# Patient Record
Sex: Male | Born: 1955 | Race: White | Hispanic: No | Marital: Married | Smoking: Former smoker
Health system: Southern US, Community
[De-identification: ages and names within clinical notes are randomized; demographics above are authoritative.]

## PROBLEM LIST (undated history)

## (undated) DIAGNOSIS — Z973 Presence of spectacles and contact lenses: Secondary | ICD-10-CM

## (undated) DIAGNOSIS — K2990 Gastroduodenitis, unspecified, without bleeding: Secondary | ICD-10-CM

## (undated) DIAGNOSIS — R931 Abnormal findings on diagnostic imaging of heart and coronary circulation: Secondary | ICD-10-CM

## (undated) DIAGNOSIS — K2970 Gastritis, unspecified, without bleeding: Secondary | ICD-10-CM

## (undated) DIAGNOSIS — Z8372 Family history of familial adenomatous polyposis: Secondary | ICD-10-CM

## (undated) DIAGNOSIS — D1391 Familial adenomatous polyposis: Secondary | ICD-10-CM

## (undated) DIAGNOSIS — K219 Gastro-esophageal reflux disease without esophagitis: Secondary | ICD-10-CM

## (undated) DIAGNOSIS — E785 Hyperlipidemia, unspecified: Secondary | ICD-10-CM

## (undated) DIAGNOSIS — I219 Acute myocardial infarction, unspecified: Secondary | ICD-10-CM

## (undated) DIAGNOSIS — Z8371 Family history of colonic polyps: Secondary | ICD-10-CM

## (undated) DIAGNOSIS — Z83719 Family history of colon polyps, unspecified: Secondary | ICD-10-CM

## (undated) HISTORY — DX: Family history of colonic polyps: Z83.71

## (undated) HISTORY — PX: SIGMOIDOSCOPY: SUR1295

## (undated) HISTORY — PX: UPPER GASTROINTESTINAL ENDOSCOPY: SHX188

## (undated) HISTORY — PX: COLONOSCOPY: SHX174

## (undated) HISTORY — DX: Familial adenomatous polyposis: D13.91

## (undated) HISTORY — PX: OTHER SURGICAL HISTORY: SHX169

## (undated) HISTORY — DX: Gastritis, unspecified, without bleeding: K29.90

## (undated) HISTORY — PX: ROTATOR CUFF REPAIR: SHX139

## (undated) HISTORY — DX: Abnormal findings on diagnostic imaging of heart and coronary circulation: R93.1

## (undated) HISTORY — PX: CARDIAC CATHETERIZATION: SHX172

## (undated) HISTORY — DX: Gastro-esophageal reflux disease without esophagitis: K21.9

## (undated) HISTORY — DX: Hyperlipidemia, unspecified: E78.5

## (undated) HISTORY — DX: Gastritis, unspecified, without bleeding: K29.70

## (undated) HISTORY — DX: Family history of colon polyps, unspecified: Z83.719

## (undated) HISTORY — DX: Acute myocardial infarction, unspecified: I21.9

## (undated) HISTORY — DX: Family history of familial adenomatous polyposis: Z83.72

## (undated) HISTORY — PX: WRIST SURGERY: SHX841

---

## 2000-12-05 HISTORY — PX: WRIST SURGERY: SHX841

## 2004-11-07 ENCOUNTER — Ambulatory Visit (HOSPITAL_COMMUNITY): Admission: RE | Admit: 2004-11-07 | Discharge: 2004-11-07 | Payer: Self-pay | Admitting: Neurosurgery

## 2010-12-25 ENCOUNTER — Encounter: Payer: Self-pay | Admitting: Neurosurgery

## 2017-07-13 DIAGNOSIS — M6240 Contracture of muscle, unspecified site: Secondary | ICD-10-CM

## 2017-07-13 DIAGNOSIS — M722 Plantar fascial fibromatosis: Secondary | ICD-10-CM | POA: Insufficient documentation

## 2017-07-13 HISTORY — DX: Plantar fascial fibromatosis: M72.2

## 2017-07-13 HISTORY — DX: Contracture of muscle, unspecified site: M62.40

## 2018-01-01 DIAGNOSIS — R739 Hyperglycemia, unspecified: Secondary | ICD-10-CM | POA: Insufficient documentation

## 2018-01-01 DIAGNOSIS — I10 Essential (primary) hypertension: Secondary | ICD-10-CM

## 2018-01-01 DIAGNOSIS — Z720 Tobacco use: Secondary | ICD-10-CM | POA: Insufficient documentation

## 2018-01-01 DIAGNOSIS — I2102 ST elevation (STEMI) myocardial infarction involving left anterior descending coronary artery: Secondary | ICD-10-CM

## 2018-01-01 HISTORY — DX: Hyperglycemia, unspecified: R73.9

## 2018-01-01 HISTORY — DX: ST elevation (STEMI) myocardial infarction involving left anterior descending coronary artery: I21.02

## 2018-01-01 HISTORY — DX: Essential (primary) hypertension: I10

## 2018-01-01 HISTORY — DX: Tobacco use: Z72.0

## 2018-01-30 DIAGNOSIS — E1159 Type 2 diabetes mellitus with other circulatory complications: Secondary | ICD-10-CM

## 2018-01-30 HISTORY — DX: Type 2 diabetes mellitus with other circulatory complications: E11.59

## 2018-02-28 DIAGNOSIS — E785 Hyperlipidemia, unspecified: Secondary | ICD-10-CM

## 2018-02-28 DIAGNOSIS — I2510 Atherosclerotic heart disease of native coronary artery without angina pectoris: Secondary | ICD-10-CM

## 2018-02-28 HISTORY — DX: Atherosclerotic heart disease of native coronary artery without angina pectoris: I25.10

## 2018-03-14 DIAGNOSIS — R17 Unspecified jaundice: Secondary | ICD-10-CM

## 2018-03-14 DIAGNOSIS — R42 Dizziness and giddiness: Secondary | ICD-10-CM

## 2018-03-14 HISTORY — DX: Dizziness and giddiness: R42

## 2018-03-14 HISTORY — DX: Unspecified jaundice: R17

## 2018-12-17 ENCOUNTER — Inpatient Hospital Stay: Payer: Managed Care, Other (non HMO) | Attending: Genetic Counselor | Admitting: Genetic Counselor

## 2018-12-17 ENCOUNTER — Inpatient Hospital Stay: Payer: Managed Care, Other (non HMO)

## 2018-12-17 ENCOUNTER — Encounter: Payer: Self-pay | Admitting: Genetic Counselor

## 2018-12-17 DIAGNOSIS — Z8371 Family history of colonic polyps: Secondary | ICD-10-CM

## 2018-12-17 DIAGNOSIS — Z1379 Encounter for other screening for genetic and chromosomal anomalies: Secondary | ICD-10-CM

## 2018-12-17 DIAGNOSIS — Z860100 Personal history of colon polyps, unspecified: Secondary | ICD-10-CM

## 2018-12-17 HISTORY — DX: Personal history of colon polyps, unspecified: Z86.0100

## 2018-12-17 HISTORY — DX: Personal history of colonic polyps: Z86.010

## 2018-12-17 NOTE — Progress Notes (Signed)
REFERRING PROVIDER: Jackquline Denmark, MD [address] [place],  [address]  PRIMARY PROVIDER:  [name], MD  PRIMARY REASON FOR VISIT:  1. History of colon polyps   2. Family history of colonic polyps   3. Family history of FAP (familial adenomatous polyposis)      HISTORY OF PRESENT ILLNESS:   Larry Warner, a 63 y.o. male, was seen for a [facility] cancer genetics consultation at the request of Dr. Lyndel Safe due to a personal and family history of colon polyps and a need for a total colectomy.  Larry Warner presents to clinic today to discuss the possibility of a hereditary predisposition to cancer, genetic testing, and to further clarify his future cancer risks, as well as potential cancer risks for family members.   In 2013, at the age of 31, Larry Warner was diagnosed with 100's of colon polyps on his colonoscopy.  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  He notified family members, and his siblings had colonoscopies. His brother was noted to have colon polyps and had a colectomy.  While is sister and mother had colon polyps, they did not have the number that Larry Warner had and had polypectomy.  Larry Warner son underwent colonoscopy in the fall 2019 and was found to have 100-150 colon polyps.  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    CANCER HISTORY:   No history exists.     Past Medical History:  Diagnosis Date  . Family history of colonic polyps   . Family history of FAP (familial adenomatous polyposis)      Social History   Socioeconomic History  . Marital status: Married    Spouse name: Not on file  . Number of children: Not on file  . Years of education: Not on file  . Highest education level: Not on file  Occupational History  . Not on file  Social Needs  . Financial resource strain: Not on file  . Food insecurity:   Worry: Not on file    Inability: Not on file  . Transportation needs:    Medical: Not on file    Non-medical: Not on file  Tobacco Use  . Smoking status: Not on file  Substance and Sexual Activity  . Alcohol use: Not on file  . Drug use: Not on file  . Sexual activity: Not on file  Lifestyle  . Physical activity:    Days per week: Not on file    Minutes per session: Not on file  . Stress: Not on file  Relationships  . Social connections:    Talks on phone: Not on file    Gets together: Not on file    Attends religious service: Not on file    Active member of club or organization: Not on file    Attends meetings of clubs or organizations: Not on file    Relationship status: Not on file  Other Topics Concern  . Not on file  Social History Narrative  . Not on file     FAMILY HISTORY:  We obtained a detailed, 4-generation family history.  Significant diagnoses are listed below: Family History  Problem Relation Age of Onset  . Colon polyps Mother        <12 polyps  . Colon polyps Sister   . Colon polyps Brother  had colectomy due to polyp count  . Colon polyps Son 31       total colectomy  . Familial polyposis Son        Bartholome Bill mutation    The patient had one son who is cancer free, but has had a colectomy due to colon polyps and was identified as having a likely pathogenic mutation in [place].  He has a brother and sister, and two maternal half brothers who are all cancer free.  His full brother had multiple colon polyps and had a colectomy.  His sister was found to have colon polyps, and his half brothers have not had colonoscopies.  The patient's father is deceased and his mother is living.  The patient's mother has had <12 colon polyps.  She has a brother and two sisters who are all cancer free.  One sister had a son who died at 92 and had 'tumors all over his body'.  The maternal grandparents are deceased.  The patient's father committed suicide at 49.  He has one  brother who is cancer free.  His parents died of non cancer related issues.  Larry Warner is unaware of previous family history of genetic testing for hereditary cancer risks. Patient's maternal ancestors are of Caucasian descent, and paternal ancestors are of Caucasian descent. There is no reported Ashkenazi Jewish ancestry. There is no known consanguinity.  GENETIC COUNSELING ASSESSMENT: Larry Warner is a 63 y.o. male with a personal and family history of colon polyps necessitating a colectomy which is somewhat suggestive of a Familial Adenomatous Polyposis and predisposition to cancer. We, therefore, discussed and recommended the following at today's visit.   DISCUSSION: We discussed that FAP is thought to make up about 1% of all hereditary colon cancer syndromes.  There are two forms of FAP - Classical and Attenuated.  Individuals with classic FAP tend to have more polyps at younger ages, with a risk for cancer reaching 100%.  Attenuated FAP (AFAP) is associated with fewer polyps at older ages and an cancer risk approaching a 70% lifetime risk.  Larry Warner son was diagnosed with FAP and a likely pathogenic mutation was identified.  Larry Warner has a 50% risk for having this same mutation, however, we anticipate that it will be a confirmatory test due to his personal history.  We reviewed the characteristics, features and inheritance patterns of hereditary cancer syndromes. We also discussed genetic testing, including the appropriate family members to test, the process of testing, insurance coverage and turn-around-time for results. We discussed the implications of a negative, positive and/or variant of uncertain significant result. We recommended Larry Warner pursue genetic testing for the APC gene mutation.   Based on Larry Warner personal and family history of cancer, he meets medical criteria for genetic testing. Despite that he meets criteria, he may still have an out of pocket cost. We  discussed that if his out of pocket cost for testing is over $100, the laboratory will call and confirm whether he wants to proceed with testing.  If the out of pocket cost of testing is less than $100 he will be billed by the genetic testing laboratory.   PLAN: After considering the risks, benefits, and limitations, Larry Warner  provided informed consent to pursue genetic testing and the blood sample was sent to [facility] for analysis of the APC targeted test. Results should be available within approximately 2-3 weeks' time, at which point they will be disclosed by telephone to Larry Warner,  as will any additional recommendations warranted by these results. Larry Warner will receive a summary of his genetic counseling visit and a copy of his results once available. This information will also be available in Epic. We encouraged Larry. Ahr to remain in contact with cancer genetics annually so that we can continuously update the family history and inform him of any changes in cancer genetics and testing that may be of benefit for his family. Larry. Bunch questions were answered to his satisfaction today. Our contact information was provided should additional questions or concerns arise.  Based on Larry. Zorn family history, we recommended his brother, sister and mother, who were all diagnosed with colon polyps, have genetic counseling and testing. Larry Warner will let us know if we can be of any assistance in coordinating genetic counseling and/or testing for this family member.   Lastly, we encouraged Larry. Capers to remain in contact with cancer genetics annually so that we can continuously update the family history and inform him of any changes in cancer genetics and testing that may be of benefit for this family.   Larry.  Maus questions were answered to his satisfaction today. Our contact information was provided should additional questions or concerns arise. Thank you for the referral  and allowing [place] to share in the care of your patient.   Telina Kleckley P. Florene Glen, [place], [facility] Certified Genetic Counselor [email or web address] phone: [phone]  The patient was seen for a total of 60 minutes in face-to-face genetic counseling.  This patient was discussed with Drs. Magrinat, Lindi Adie and/or Burr Medico who agrees with the above.    _______________________________________________________________________ For Office Staff:  Number of people involved in session: 2 Was an Intern/ student involved with case: no

## 2018-12-28 ENCOUNTER — Encounter: Payer: Self-pay | Admitting: Genetic Counselor

## 2018-12-28 ENCOUNTER — Telehealth: Payer: Self-pay | Admitting: Genetic Counselor

## 2018-12-28 DIAGNOSIS — Z1379 Encounter for other screening for genetic and chromosomal anomalies: Secondary | ICD-10-CM

## 2018-12-28 DIAGNOSIS — Z1509 Genetic susceptibility to other malignant neoplasm: Secondary | ICD-10-CM | POA: Insufficient documentation

## 2018-12-28 DIAGNOSIS — Z1589 Genetic susceptibility to other disease: Secondary | ICD-10-CM

## 2018-12-28 HISTORY — DX: Genetic susceptibility to other malignant neoplasm: Z15.09

## 2018-12-28 HISTORY — DX: Encounter for other screening for genetic and chromosomal anomalies: Z13.79

## 2018-12-28 HISTORY — DX: Genetic susceptibility to other disease: Z15.89

## 2018-12-28 NOTE — Telephone Encounter (Signed)
LM on VM that results were back and to please call back. 

## 2018-12-31 NOTE — Telephone Encounter (Signed)
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  Discussed that this is confirmation of his clinical diagnosis of FAP.  Further discussed the importance of getting family members tested.  He confirmed that he has discussed with his family the importance of testing and is trying to get everyone in.

## 2019-01-02 ENCOUNTER — Ambulatory Visit: Payer: Self-pay | Admitting: Genetic Counselor

## 2019-01-02 DIAGNOSIS — Z1589 Genetic susceptibility to other disease: Secondary | ICD-10-CM

## 2019-01-02 DIAGNOSIS — Z1379 Encounter for other screening for genetic and chromosomal anomalies: Secondary | ICD-10-CM

## 2019-01-02 NOTE — Progress Notes (Signed)
GENETIC TEST RESULTS   Patient Name: Larry Warner Patient Age: 63 y.o. Encounter Date: 01/02/2019  Referring Provider: Jackquline Denmark, MD    Mr. Viveros was seen in the [facility] clinic on December 31, 2018 due to a personal and family history of colon polyps and concern regarding a hereditary predisposition to cancer in the family. Please refer to the prior Genetics clinic note for more information regarding Mr. Worrall's medical and family histories and our assessment at the time.   FAMILY HISTORY:  We obtained a detailed, 4-generation family history.  Significant diagnoses are listed below: Family History  Problem Relation Age of Onset  . Colon polyps Mother        <12 polyps  . Colon polyps Sister   . Colon polyps Brother        had colectomy due to polyp count  . Colon polyps Son 56       total colectomy  . Familial polyposis Son        Bartholome Bill mutation    The patient had one son who is cancer free, but has had a colectomy due to colon polyps and was identified as having a likely pathogenic mutation in [place].  He has a brother and sister, and two maternal half brothers who are all cancer free.  His full brother had multiple colon polyps and had a colectomy.  His sister was found to have colon polyps, and his half brothers have not had colonoscopies.  The patient's father is deceased and his mother is living.  The patient's mother has had <12 colon polyps.  She has a brother and two sisters who are all cancer free.  One sister had a son who died at 8 and had 'tumors all over his body'.  The maternal grandparents are deceased.  The patient's father committed suicide at 81.  He has one brother who is cancer free.  His parents died of non cancer related issues.  Mr. Kuwahara is unaware of previous family history of genetic testing for hereditary cancer risks. Patient's maternal ancestors are of Caucasian descent, and paternal ancestors are of Caucasian descent. There is no  reported Ashkenazi Jewish ancestry. There is no known consanguinity.    GENETIC TESTING: At the time of Mr. Creighton's visit, we recommended he pursue genetic testing for the specific APC mutation that was identified in the family. 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    CLINICAL CONDITION: Familial adenomatous polyposis (FAP) is a colorectal cancer predisposition syndrome characterized by the development of hundreds to thousands of precancerous (adenomatous) polyps, typically beginning in adolescence or early adulthood. Without a prophylactic colectomy, affected individuals have a lifetime risk of nearly 100% of developing colorectal cancer (PMID: 81829937, 16967893, 8101751).  Gastric adenocarcinoma and proximal polyposis of the stomach (GAPPS) is characterized by fundic gland polyposis and an increased risk of gastric cancer (gastric adenocarcinoma). Polyps are often associated with low-grade and focally high-grade dysplasia. Unlike other pathogenic variants in APC, the risk of colorectal polyposis and cancer appears to be lower. GAPPS is caused by pathogenic variants in promoter 1B of the APC gene (PMID: 02585277, 82423536, 14431540, 08676195, 09326712).  FAP was previously divided into subtypes including [place] and Gardner syndromes. These subtypes were defined by the presence of certain extracolonic findings such as desmoid tumors, sebaceous cysts, osteomas, supernumerary teeth, and cancers of the duodenum, exocrine pancreas, thyroid (papillary adenocarcinoma), liver (hepatoblastomas), and central  nervous system (medulloblastomas). It is now recognized that these subtypes are part of the clinical spectrum of APC-associated polyposis conditions (PMID: 95638756).  FAP may increase the risk of developing other types of non-colonic cancers:   4-12% risk of cancer of the duodenum  (PMID: 43329518, 84166063)  1-12% risk of papillary thyroid cancer (PMID: 01601093)  <1% risk of stomach cancer (PMID: 23557322)  <1% risk of brain/central nervous system malignancies (PMID: 02542706)  1-2% risk of hepatoblastoma in children (PMID: 2376283, 1517616)  increased risk of pancreatic cancer (PMID: 07371062, 69485462, 7035009)  Attenuated FAP (AFAP) has a later age of onset than classic FAP (approximately 61 years of age), presents with fewer adenomatous polyps (<100) that are primarily proximal (right-sided), and has an overall lower lifetime risk of developing colorectal cancer of approximately 70% (PMID: 38182993, 71696789, 3810175, 10258527).  GENE INFORMATION: APC is a tumor suppressor gene that acts as an antagonist of the Wnt signaling pathway. It is also involved in other processes including cell migration and adhesion, transcriptional activation, and apoptosis (NCBI Gene. Gene ID: [id number]. [email or web address]. Accessed February 2018). If there is a pathogenic variant in this gene that prevents it from normally functioning, there is an increased risk to develop certain types of cancers.  INHERITANCE: FAP has autosomal dominant inheritance. This means that an individual with a pathogenic variant has a 50% chance of passing the condition on to their offspring. With this result, it is now possible to identify at-risk relatives who can pursue testing for this specific familial variant. While many cases are inherited from a parent, some occur spontaneously (PMID: 712 229 0669). This means that an individual with a pathogenic variant has parents who do not have it. However, that individual now has a 50% risk of passing it on to future offspring.  MANAGEMENT: Medical management and surveillance protocols have been developed by the [employer]  (NCCN) for individuals with FAP and AFAP (NCCN Clinical Practice Guidelines in Oncology: Genetic/Familial High  Risk Assessment: Colorectal. Version 3.2017). These recommendations apply to individuals diagnosed with FAP and AFAP based on clinical findings or genetic test results, as well as individuals at an increased risk based on family history who have not had negative genetic testing:  Classic FAP Colon:   Annual sigmoidoscopy/colonoscopy beginning at 54-30 years of age.  A colectomy or proctocolectomy is recommended after numerous polyps are detected, due to their high malignant potential. Total proctocolectomy with ileal pouch-anal anastomosis (IPAA) is generally the recommended surgical approach for individuals with FAP.  If colectomy with ileorectal anastomosis (IRA) is performed, endoscopic evaluation of the remaining rectum is recommended every 6-12 months depending on polyp burden.  If total proctocolectomy with IPAA or ileostomy is performed, endoscopic evaluation of the ileal pouch or ileostomy is recommended every 1-3 years depending on polyp burden. If large, flat polyps with villous histology or polyps with high-grade dysplasia are identified, then surveillance frequency should be every 6 months.  Chemoprevention (e.g., sulindac) can aid in management of the remaining rectum; however, there are no medications currently approved by the FDA for this indication. There are data to suggest that sulindac showed the most significant polyp regression, but it is unclear if the decrease in polyp burden equates to reduction in colorectal cancer risk.  Extracolonic:  Upper endoscopy with complete visualization of the ampulla of Vater beginning at age 23-25 years.  Consider upper endoscopy at an earlier age if colectomy is performed prior to 61 years of age. Frequency of upper endoscopy surveillance is dependent on  polyp burden. It is important to note that fundic gland polyps are common in individuals with FAP and while focal low-grade dysplasia can be identified, it is typically non-progressive.  Non-fundic gland polyps should be managed endoscopically if possible. Polyps with high-grade dysplasia or invasive cancer detected on biopsy should be referred for gastrectomy.  Annual thyroid examination beginning in the late teenage years. Annual thyroid ultrasound may be considered, but data are lacking to support this recommendation.  Annual physical examination for CNS cancers.  Annual abdominal palpation for desmoids.  If there is family history of symptomatic desmoids, consider abdominal MRI or CT within 1-3 years post-colectomy, then every 5-10 years. Abdominal symptoms should prompt immediate abdominal imaging; however, data to support screening and treatment are limited. For small bowel polyps and cancer, consider adding small bowel visualization to MRI or CT for desmoids especially, if duodenal polyposis is advanced  Consider liver palpation, abdominal ultrasound, and measurement of AFP every 3-6 months during the first 5 years of life to screen for hepatoblastoma.  AFAP Colon:   Colonoscopy beginning in the late teens, then every 2-3 years.  If a small polyp burden is found, repeat colonoscopy with polypectomy every 1-2 years.  If polyp burden is too great, consider colectomy with ileorectal anastomosis (IRA) or proctocolectomy with ileal pouch-anal anastomosis (IPAA) if rectal polyposis is not manageable with polypectomy. Following colectomy with IRA, endoscopic evaluation of the remaining rectum is recommended every 6-12 months depending on polyp burden. Chemoprevention (e.g., sulindac) can aid in management of the remaining rectum; however, there are no medications currently approved by the FDA for this indication. There are data to suggest that sulindac showed the most significant polyp regression, but it is unclear if the decrease in polyp burden equates to reduction in colorectal cancer risk.  Extracolonic:  Annual physical examination.  Annual thyroid  examination.  Upper endoscopy with complete visualization of the ampulla of Vater beginning at around age 92-25 years. Consider upper endoscopy at an earlier age if colectomy is performed prior to 61 years of age. Frequency of upper endoscopy surveillance is dependent on polyp burden.  FAMILY MEMBERS: It is important that all of Mr. Aday relatives (both men and women) know of the presence of this gene mutation. Site-specific genetic testing can sort out who in the family is at risk and who is not. At this time we cannot predict the presentation of this mutation, however based on family history it is suggestive that it is presenting as A-FAP rather than a more classic form.  Family members will need to talk with their GI provider to determine how they would best be followed.  Mr. Guyette children and siblings have a 50% chance to have inherited this mutation. We recommend they have genetic testing for this same mutation, as identifying the presence of this mutation would allow them to also take advantage of risk-reducing measures.   SUPPORT AND RESOURCES: If Mr. Valone is interested in FAP-specific information and support, there are two groups, Hereditary Colon Cancer Takes Guts (www.hcctakesguts.org), and Colorectal cancer alliance (www.ccalliance.org) which some people have found useful. They provide opportunities to speak with other individuals from high-risk families. To locate genetic counselors in other cities, visit the website of the [employer] (ArtistMovie.se) and Secretary/administrator for a Social worker by zip code.  We encouraged Mr. Kissling to remain in contact with [place] on an annual basis so we can update his personal and family histories, and let him know of advances in cancer  genetics that may benefit the family. Our contact number was provided. Mr. Skillin questions were answered to his satisfaction today, and he knows he is welcome to call anytime with additional questions.    Zaharah Amir P. Florene Glen, [place], [facility] Certified Genetic Counselor [email or web address] phone: [phone]

## 2019-01-23 NOTE — Progress Notes (Signed)
Thanks Santiago Glad for taking care of him. I was out on vacation and just got back Excellent note  Bre, can you forward Karen's note to Mr Seevers's PCP as well

## 2019-01-24 NOTE — Progress Notes (Signed)
Note routed to PCP per MD recommendation

## 2019-03-21 ENCOUNTER — Encounter: Payer: Self-pay | Admitting: Gastroenterology

## 2019-05-10 DIAGNOSIS — I519 Heart disease, unspecified: Secondary | ICD-10-CM

## 2019-05-10 HISTORY — DX: Heart disease, unspecified: I51.9

## 2019-05-20 ENCOUNTER — Encounter: Payer: Self-pay | Admitting: Gastroenterology

## 2019-05-22 ENCOUNTER — Telehealth (INDEPENDENT_AMBULATORY_CARE_PROVIDER_SITE_OTHER): Payer: Managed Care, Other (non HMO) | Admitting: Gastroenterology

## 2019-05-22 ENCOUNTER — Encounter: Payer: Self-pay | Admitting: Gastroenterology

## 2019-05-22 ENCOUNTER — Other Ambulatory Visit: Payer: Self-pay

## 2019-05-22 VITALS — Ht 68.0 in | Wt 205.0 lb

## 2019-05-22 DIAGNOSIS — Z8371 Family history of colonic polyps: Secondary | ICD-10-CM

## 2019-05-22 DIAGNOSIS — D126 Benign neoplasm of colon, unspecified: Secondary | ICD-10-CM

## 2019-05-22 NOTE — Patient Instructions (Signed)
If you are age 61 or older, your body mass index should be between 23-30. Your Body mass index is 31.17 kg/m. If this is out of the aforementioned range listed, please consider follow up with your Primary Care Provider.  If you are age 12 or younger, your body mass index should be between 19-25. Your Body mass index is 31.17 kg/m. If this is out of the aformentioned range listed, please consider follow up with your Primary Care Provider.   You have been scheduled for a flexible sigmoidoscopy. Please follow the written instructions given to you at your visit today. If you use inhalers (even only as needed), please bring them with you on the day of your procedure.  To help prevent the possible spread of infection to our patients, communities, and staff; we will be implementing the following measures:  As of now we are not allowing any visitors/family members to accompany you to any upcoming appointments with [facility]. If you have any concerns about this please contact our office to discuss prior to the appointment.   You will be due for a recall Flex Sig and EGD in 2021. We will send you a reminder in the mail when it gets closer to that time.   Thank you,  Dr. Jackquline Denmark

## 2019-05-22 NOTE — Progress Notes (Signed)
Chief Complaint: FU  Referring Provider:  [name], MD      ASSESSMENT AND PLAN;   #1. 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 [place]). 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  #2.  Comorbid conditions CAD s/p DES 12/2017 (getting off Brilinta June end 2020), HTN, HLD, LVD (EF 45-50%), DM2.  Plan: - FS without sedation at [facility] in [place] (once he is off Brilinta).  Continue baby ASA throughout. -Continue Celebrex 200 mg p.o. twice daily. -Would recommend EGD (with conventional scope and ERCP scope) and repeat flex sig  2021.  Since this has to be done at [facility], we would like to avoid any potential COVID exposure from the hospital this year.  Also would set him up for US abdomen next year. HPI:    Larry Warner is a 63 y.o. male  For follow-up visit.  No nausea, vomiting, heartburn, regurgitation, odynophagia or dysphagia.  No significant diarrhea or constipation.  There is no melena or hematochezia. No unintentional weight loss.  Has been under care of geneticist Santiago Glad as well.  Past GI procedures: -Had several flexible sigmoidoscopies since surgery.  Was initially having it every 6 months.  Last FS 01/04/2017: Small external and internal hemorrhoids, otherwise normal to anastomosis. -Several EGDs.  Last EGD 01/04/2017 (EGD scope and ERCP scope)-mild gastritis.  Otherwise normal.  No periampullary tumors. Past Medical History:  Diagnosis Date  . Family history of colonic polyps   . Family history of FAP (familial adenomatous polyposis)   . GERD (gastroesophageal reflux disease)   . Hyperlipidemia     Past Surgical History:  Procedure Laterality Date  . 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 ESOPHAGOGASTRODUODENOSCOPY  01/04/2017   Mild gastrtis. Otherwise normal EGD with convetional scope and ERCP scope. No periampullary tumors.   .  Laprascopic assisted subtotal colectomy    . WRIST SURGERY      Family History  Problem Relation Age of Onset  . Colon polyps Mother        <12 polyps  . Colon polyps Sister   . Colon polyps Brother        had colectomy due to polyp count  . Colon polyps Son 3       total colectomy  . Familial polyposis Son        APC mutation  . Colon cancer Neg Hx     Social History   Tobacco Use  . Smoking status: Never Smoker  . Smokeless tobacco: Never Used  Substance Use Topics  . Alcohol use: Never    Frequency: Never  . Drug use: Not on file    Current Outpatient Medications  Medication Sig Dispense Refill  . aspirin 81 MG chewable tablet Chew by mouth daily.    Marland Kitchen atorvastatin (LIPITOR) 80 MG tablet Take 80 mg by mouth daily.    . carvedilol (COREG) 6.25 MG tablet Take 6.25 mg by mouth 2 (two) times daily with a meal.    . celecoxib (CELEBREX) 200 MG capsule Take 200 mg by mouth 2 (two) times daily.    Marland Kitchen glipiZIDE (GLUCOTROL) 5 MG tablet Take by mouth daily before breakfast.    . losartan (COZAAR) 25 MG tablet Take 25 mg by mouth daily.    . metFORMIN (GLUCOPHAGE) 850 MG tablet Take 850 mg by mouth 2 (two) times daily with a meal.    . ticagrelor (BRILINTA)  90 MG TABS tablet Take by mouth daily.    Marland Kitchen tolterodine (DETROL LA) 4 MG 24 hr capsule Take 4 mg by mouth daily.     No current facility-administered medications for this visit.     Not on File  Review of Systems:  neg     Physical Exam:    Ht 5\' 8"  (1.727 m)   Wt 205 lb (93 kg)   BMI 31.17 kg/m  Filed Weights   05/22/19 0808  Weight: 205 lb (93 kg)  televist   This service was provided via telemedicine.  The patient was located at work.  The provider was located in work.  The patient did consent to this telephone visit and is aware of possible charges through their insurance for this visit.  The patient was referred by Dr. Truman Hayward.    Time spent on call/coordination: 15 min   Carmell Austria, MD 05/22/2019, 8:51  AM  Cc: [name], MD

## 2019-05-23 ENCOUNTER — Ambulatory Visit: Payer: Managed Care, Other (non HMO) | Admitting: Gastroenterology

## 2019-06-26 ENCOUNTER — Telehealth: Payer: Self-pay | Admitting: Gastroenterology

## 2019-06-26 NOTE — Telephone Encounter (Signed)

## 2019-06-27 ENCOUNTER — Other Ambulatory Visit: Payer: Self-pay

## 2019-06-27 ENCOUNTER — Ambulatory Visit (AMBULATORY_SURGERY_CENTER): Payer: Managed Care, Other (non HMO) | Admitting: Gastroenterology

## 2019-06-27 ENCOUNTER — Encounter: Payer: Self-pay | Admitting: Gastroenterology

## 2019-06-27 VITALS — BP 128/87 | HR 60 | Temp 97.3°F | Resp 19 | Ht 68.0 in | Wt 205.0 lb

## 2019-06-27 DIAGNOSIS — Z8371 Family history of colonic polyps: Secondary | ICD-10-CM | POA: Diagnosis present

## 2019-06-27 DIAGNOSIS — K648 Other hemorrhoids: Secondary | ICD-10-CM | POA: Diagnosis not present

## 2019-06-27 MED ORDER — SODIUM CHLORIDE 0.9 % IV SOLN: 500.0000 mL | Freq: Once | INTRAVENOUS | Status: DC

## 2019-06-27 NOTE — Progress Notes (Signed)
Pt's states no medical or surgical changes since previsit or office visit. 

## 2019-06-27 NOTE — Progress Notes (Signed)
Patient had no sedation. Discharge to home.

## 2019-06-27 NOTE — Patient Instructions (Signed)
Discharge instructions given. Handout on hemmhoroids. Resume previous medications. YOU HAD AN ENDOSCOPIC PROCEDURE TODAY AT [facility]:   Refer to the procedure report that was given to you for any specific questions about what was found during the examination.  If the procedure report does not answer your questions, please call your gastroenterologist to clarify.  If you requested that your care partner not be given the details of your procedure findings, then the procedure report has been included in a sealed envelope for you to review at your convenience later.  YOU SHOULD EXPECT: Some feelings of bloating in the abdomen. Passage of more gas than usual.  Walking can help get rid of the air that was put into your GI tract during the procedure and reduce the bloating. If you had a lower endoscopy (such as a colonoscopy or flexible sigmoidoscopy) you may notice spotting of blood in your stool or on the toilet paper. If you underwent a bowel prep for your procedure, you may not have a normal bowel movement for a few days.  Please Note:  You might notice some irritation and congestion in your nose or some drainage.  This is from the oxygen used during your procedure.  There is no need for concern and it should clear up in a day or so.  SYMPTOMS TO REPORT IMMEDIATELY:   Following lower endoscopy (colonoscopy or flexible sigmoidoscopy):  Excessive amounts of blood in the stool  Significant tenderness or worsening of abdominal pains  Swelling of the abdomen that is new, acute  Fever of 100F or higher   For urgent or emergent issues, a gastroenterologist can be reached at any hour by calling [phone].   DIET:  We do recommend a small meal at first, but then you may proceed to your regular diet.  Drink plenty of fluids but you should avoid alcoholic beverages for 24 hours.  ACTIVITY:  You should plan to take it easy for the rest of today and you should NOT DRIVE or use heavy  machinery until tomorrow (because of the sedation medicines used during the test).    FOLLOW UP: Our staff will call the number listed on your records 48-72 hours following your procedure to check on you and address any questions or concerns that you may have regarding the information given to you following your procedure. If we do not reach you, we will leave a message.  We will attempt to reach you two times.  During this call, we will ask if you have developed any symptoms of COVID 19. If you develop any symptoms (ie: fever, flu-like symptoms, shortness of breath, cough etc.) before then, please call [phone].  If you test positive for Covid 19 in the 2 weeks post procedure, please call and report this information to [place].    If any biopsies were taken you will be contacted by phone or by letter within the next 1-3 weeks.  Please call us at [phone] if you have not heard about the biopsies in 3 weeks.    SIGNATURES/CONFIDENTIALITY: You and/or your care partner have signed paperwork which will be entered into your electronic medical record.  These signatures attest to the fact that that the information above on your After Visit Summary has been reviewed and is understood.  Full responsibility of the confidentiality of this discharge information lies with you and/or your care-partner.

## 2019-06-27 NOTE — Progress Notes (Signed)
Report to PACU, RN, vss, BBS= Clear.  

## 2019-06-27 NOTE — Op Note (Signed)
[place] Patient Name: Larry Warner Procedure Date: 06/27/2019 8:10 AM MRN: [id number] Endoscopist: Jackquline Denmark , MD Age: 63 Referring MD:  Date of Birth: 10/29/1956 Gender: Male Account #: [id number] Procedure:                Flexible Sigmoidoscopy Indications:              FAP s/p subtotal colectomy with ileorectal                            anastomosis 02/2011 (Dr Darnell Level. [place]). Positive for                            single, heterozygous pathogenic gene APC                            mutation(c.[id number]>G) 2019. Medicines:                None Procedure:                Pre-Anesthesia Assessment:                           - Prior to the procedure, a History and Physical                            was performed, and patient medications and                            allergies were reviewed. The patient's tolerance of                            previous anesthesia was also reviewed. The risks                            and benefits of the procedure and the sedation                            options and risks were discussed with the patient.                            All questions were answered, and informed consent                            was obtained. Prior Anticoagulants: The patient has                            taken no previous anticoagulant or antiplatelet                            agents. ASA Grade Assessment: II - A patient with                            mild systemic disease. After reviewing the risks  and benefits, the patient was deemed in                            satisfactory condition to undergo the procedure.                           After obtaining informed consent, the scope was                            passed under direct vision. The Colonoscope was                            introduced through the anus and advanced to the the                            ileo-rectal anastomosis. The flexible sigmoidoscopy                   was accomplished without difficulty. The patient                            tolerated the procedure well. The quality of the                            bowel preparation was good. Scope In: Scope Out: Findings:                 Hemorrhoids were found on perianal exam.                           The entire examined colon appeared normal. The                            ileorectal anastomosis was identified at 15 cm from                            the anal verge. The anastomosis was patent. There                            were no polyps. Complications:            No immediate complications. Estimated Blood Loss:     Estimated blood loss: none. Impression:               -Small internal hemorrhoids.                           -Otherwise normal flexible sigmoidoscopy. Recommendation:           - Discharge patient to home.                           - Repeat flexible sigmoidoscopy and EGD (with                            conventional scope and side-viewing scope) in 1  year for surveillance.                           - Return to GI clinic PRN. Jackquline Denmark, MD 06/27/2019 8:32:46 AM This report has been signed electronically.

## 2019-09-11 DIAGNOSIS — I255 Ischemic cardiomyopathy: Secondary | ICD-10-CM | POA: Diagnosis not present

## 2019-09-11 DIAGNOSIS — N400 Enlarged prostate without lower urinary tract symptoms: Secondary | ICD-10-CM

## 2019-09-11 DIAGNOSIS — I10 Essential (primary) hypertension: Secondary | ICD-10-CM | POA: Diagnosis not present

## 2019-09-11 DIAGNOSIS — K219 Gastro-esophageal reflux disease without esophagitis: Secondary | ICD-10-CM | POA: Diagnosis not present

## 2019-09-11 DIAGNOSIS — E119 Type 2 diabetes mellitus without complications: Secondary | ICD-10-CM | POA: Diagnosis not present

## 2019-09-11 DIAGNOSIS — I249 Acute ischemic heart disease, unspecified: Secondary | ICD-10-CM | POA: Diagnosis not present

## 2019-09-12 ENCOUNTER — Inpatient Hospital Stay
Admission: AD | Admit: 2019-09-12 | Payer: Managed Care, Other (non HMO) | Source: Other Acute Inpatient Hospital | Admitting: Internal Medicine

## 2019-09-12 DIAGNOSIS — I2510 Atherosclerotic heart disease of native coronary artery without angina pectoris: Secondary | ICD-10-CM | POA: Diagnosis not present

## 2019-09-12 DIAGNOSIS — I10 Essential (primary) hypertension: Secondary | ICD-10-CM | POA: Diagnosis not present

## 2019-09-12 DIAGNOSIS — I249 Acute ischemic heart disease, unspecified: Secondary | ICD-10-CM | POA: Diagnosis not present

## 2019-09-12 DIAGNOSIS — E119 Type 2 diabetes mellitus without complications: Secondary | ICD-10-CM | POA: Diagnosis not present

## 2019-09-12 DIAGNOSIS — E785 Hyperlipidemia, unspecified: Secondary | ICD-10-CM | POA: Diagnosis not present

## 2019-09-12 DIAGNOSIS — K219 Gastro-esophageal reflux disease without esophagitis: Secondary | ICD-10-CM | POA: Diagnosis not present

## 2019-09-12 DIAGNOSIS — I255 Ischemic cardiomyopathy: Secondary | ICD-10-CM | POA: Diagnosis not present

## 2019-09-12 DIAGNOSIS — L509 Urticaria, unspecified: Secondary | ICD-10-CM

## 2019-09-13 ENCOUNTER — Observation Stay (HOSPITAL_COMMUNITY)
Admission: AD | Admit: 2019-09-13 | Discharge: 2019-09-13 | Disposition: A | Discharge status: Home / Self Care | Payer: Managed Care, Other (non HMO) | Source: Other Acute Inpatient Hospital | Attending: Cardiology | Admitting: Cardiology

## 2019-09-13 ENCOUNTER — Encounter (HOSPITAL_COMMUNITY)
Admission: AD | Disposition: A | Discharge status: Home / Self Care | Payer: Self-pay | Source: Other Acute Inpatient Hospital | Attending: Cardiology

## 2019-09-13 DIAGNOSIS — R931 Abnormal findings on diagnostic imaging of heart and coronary circulation: Secondary | ICD-10-CM

## 2019-09-13 DIAGNOSIS — Z7984 Long term (current) use of oral hypoglycemic drugs: Secondary | ICD-10-CM | POA: Diagnosis not present

## 2019-09-13 DIAGNOSIS — I255 Ischemic cardiomyopathy: Secondary | ICD-10-CM | POA: Insufficient documentation

## 2019-09-13 DIAGNOSIS — E119 Type 2 diabetes mellitus without complications: Secondary | ICD-10-CM | POA: Insufficient documentation

## 2019-09-13 DIAGNOSIS — K219 Gastro-esophageal reflux disease without esophagitis: Secondary | ICD-10-CM | POA: Diagnosis not present

## 2019-09-13 DIAGNOSIS — I5042 Chronic combined systolic (congestive) and diastolic (congestive) heart failure: Secondary | ICD-10-CM | POA: Diagnosis not present

## 2019-09-13 DIAGNOSIS — I25110 Atherosclerotic heart disease of native coronary artery with unstable angina pectoris: Secondary | ICD-10-CM | POA: Diagnosis present

## 2019-09-13 DIAGNOSIS — Z87891 Personal history of nicotine dependence: Secondary | ICD-10-CM | POA: Diagnosis not present

## 2019-09-13 DIAGNOSIS — I110 Hypertensive heart disease with heart failure: Secondary | ICD-10-CM | POA: Diagnosis not present

## 2019-09-13 DIAGNOSIS — Z79899 Other long term (current) drug therapy: Secondary | ICD-10-CM | POA: Diagnosis not present

## 2019-09-13 DIAGNOSIS — I200 Unstable angina: Secondary | ICD-10-CM | POA: Diagnosis present

## 2019-09-13 DIAGNOSIS — I2510 Atherosclerotic heart disease of native coronary artery without angina pectoris: Secondary | ICD-10-CM

## 2019-09-13 DIAGNOSIS — I10 Essential (primary) hypertension: Secondary | ICD-10-CM | POA: Diagnosis not present

## 2019-09-13 DIAGNOSIS — I252 Old myocardial infarction: Secondary | ICD-10-CM | POA: Insufficient documentation

## 2019-09-13 DIAGNOSIS — Z7982 Long term (current) use of aspirin: Secondary | ICD-10-CM | POA: Diagnosis not present

## 2019-09-13 DIAGNOSIS — E1159 Type 2 diabetes mellitus with other circulatory complications: Secondary | ICD-10-CM

## 2019-09-13 DIAGNOSIS — E785 Hyperlipidemia, unspecified: Secondary | ICD-10-CM

## 2019-09-13 HISTORY — PX: LEFT HEART CATH AND CORONARY ANGIOGRAPHY: CATH118249

## 2019-09-13 HISTORY — DX: Unstable angina: I20.0

## 2019-09-13 LAB — GLUCOSE, CAPILLARY: Glucose-Capillary: 105 mg/dL — ABNORMAL HIGH (ref 70–99)

## 2019-09-13 SURGERY — LEFT HEART CATH AND CORONARY ANGIOGRAPHY
Anesthesia: LOCAL

## 2019-09-13 MED ORDER — FENTANYL CITRATE (PF) 100 MCG/2ML IJ SOLN
INTRAMUSCULAR | Status: AC
  Filled 2019-09-13: qty 2

## 2019-09-13 MED ORDER — HEPARIN SODIUM (PORCINE) 1000 UNIT/ML IJ SOLN
INTRAMUSCULAR | Status: AC
  Filled 2019-09-13: qty 1

## 2019-09-13 MED ORDER — SODIUM CHLORIDE 0.9 % WEIGHT BASED INFUSION: 1.0000 mL/kg/h | INTRAVENOUS | Status: DC

## 2019-09-13 MED ORDER — VERAPAMIL HCL 2.5 MG/ML IV SOLN
INTRAVENOUS | Status: DC | PRN
  Administered 2019-09-13: 10 mL via INTRA_ARTERIAL

## 2019-09-13 MED ORDER — SODIUM CHLORIDE 0.9% FLUSH: 3.0000 mL | INTRAVENOUS | Status: DC | PRN

## 2019-09-13 MED ORDER — HYDRALAZINE HCL 20 MG/ML IJ SOLN: 10.0000 mg | INTRAMUSCULAR | Status: DC | PRN

## 2019-09-13 MED ORDER — LIDOCAINE HCL (PF) 1 % IJ SOLN
INTRAMUSCULAR | Status: DC | PRN
  Administered 2019-09-13: 2 mL

## 2019-09-13 MED ORDER — SODIUM CHLORIDE 0.9 % IV SOLN: INTRAVENOUS | Status: DC

## 2019-09-13 MED ORDER — SODIUM CHLORIDE 0.9 % IV SOLN: 250.0000 mL | INTRAVENOUS | Status: DC | PRN

## 2019-09-13 MED ORDER — ASPIRIN 81 MG PO CHEW: 81.0000 mg | CHEWABLE_TABLET | ORAL | Status: DC

## 2019-09-13 MED ORDER — SODIUM CHLORIDE 0.9 % IV SOLN
INTRAVENOUS | Status: AC | PRN
  Administered 2019-09-13: 100 mL/h via INTRAVENOUS

## 2019-09-13 MED ORDER — HEPARIN SODIUM (PORCINE) 1000 UNIT/ML IJ SOLN
INTRAMUSCULAR | Status: DC | PRN
  Administered 2019-09-13: 5000 [IU] via INTRAVENOUS

## 2019-09-13 MED ORDER — MIDAZOLAM HCL 2 MG/2ML IJ SOLN
INTRAMUSCULAR | Status: AC
  Filled 2019-09-13: qty 2

## 2019-09-13 MED ORDER — IOHEXOL 350 MG/ML SOLN
INTRAVENOUS | Status: DC | PRN
  Administered 2019-09-13: 60 mL

## 2019-09-13 MED ORDER — NITROGLYCERIN 0.4 MG SL SUBL: 0.4000 mg | SUBLINGUAL_TABLET | SUBLINGUAL | 3 refills | Status: DC | PRN

## 2019-09-13 MED ORDER — ISOSORBIDE MONONITRATE ER 30 MG PO TB24: 30.0000 mg | ORAL_TABLET | Freq: Every day | ORAL | 11 refills | Status: DC

## 2019-09-13 MED ORDER — LIDOCAINE HCL (PF) 1 % IJ SOLN
INTRAMUSCULAR | Status: AC
  Filled 2019-09-13: qty 30

## 2019-09-13 MED ORDER — SODIUM CHLORIDE 0.9% FLUSH: 3.0000 mL | Freq: Two times a day (BID) | INTRAVENOUS | Status: DC

## 2019-09-13 MED ORDER — HEPARIN (PORCINE) IN NACL 1000-0.9 UT/500ML-% IV SOLN
INTRAVENOUS | Status: DC | PRN
  Administered 2019-09-13 (×2): 500 mL

## 2019-09-13 MED ORDER — MIDAZOLAM HCL 2 MG/2ML IJ SOLN
INTRAMUSCULAR | Status: DC | PRN
  Administered 2019-09-13: 2 mg via INTRAVENOUS

## 2019-09-13 MED ORDER — FENTANYL CITRATE (PF) 100 MCG/2ML IJ SOLN
INTRAMUSCULAR | Status: DC | PRN
  Administered 2019-09-13: 25 ug via INTRAVENOUS

## 2019-09-13 MED ORDER — VERAPAMIL HCL 2.5 MG/ML IV SOLN
INTRAVENOUS | Status: AC
  Filled 2019-09-13: qty 2

## 2019-09-13 SURGICAL SUPPLY — 10 items
CATH 5FR JL3.5 JR4 ANG PIG MP (CATHETERS) ×1 IMPLANT
DEVICE RAD COMP TR BAND LRG (VASCULAR PRODUCTS) ×1 IMPLANT
GLIDESHEATH SLEND SS 6F .021 (SHEATH) ×1 IMPLANT
GUIDEWIRE INQWIRE 1.5J.035X260 (WIRE) IMPLANT
INQWIRE 1.5J .035X260CM (WIRE) ×2
KIT HEART LEFT (KITS) ×2 IMPLANT
PACK CARDIAC CATHETERIZATION (CUSTOM PROCEDURE TRAY) ×2 IMPLANT
SYR MEDRAD MARK 7 150ML (SYRINGE) ×2 IMPLANT
TRANSDUCER W/STOPCOCK (MISCELLANEOUS) ×2 IMPLANT
TUBING CIL FLEX 10 FLL-RA (TUBING) ×2 IMPLANT

## 2019-09-13 NOTE — Discharge Summary (Signed)
Discharge Summary    Patient ID: Larry Warner MRN: [id number]; DOB: Apr 08, 1956  Admit date: 09/13/2019 Discharge date: 09/13/2019  Primary Care Provider: [name], MD  Primary Cardiologist: [name], MD  Primary Electrophysiologist:  None   Discharge Diagnoses    Principal Problem:   Abnormal nuclear cardiac imaging test Active Problems:   Unstable angina ([place])   HTN (hypertension)   HLD (hyperlipidemia)   DM type 2 (diabetes mellitus, type 2) ([place])   Allergies Not on File  Diagnostic Studies/Procedures    Left heart catheterization 09/13/2019:  Previously placed Prox LAD to Mid LAD stent (unknown type) is widely patent.  Previously placed Prox Cx to Dist Cx stent (unknown type) is widely patent.  Ramus lesion is 100% stenosed.  LPAV lesion is 100% stenosed with 100% stenosed side branch in LPDA.  There is moderate left ventricular systolic dysfunction.  LV end diastolic pressure is normal.  The left ventricular ejection fraction is 35-45% by visual estimate.   1. CAD. Left dominant circulation    - Widely patent stent in the proximal LAD    - Widely patent stent in the mid LCx    - a tiny, wispy intermediate branch is occluded    - 100% proximal left PDA. There are collaterals to this vessel. The vessel is small in caliber 2. Moderate LV dysfunction. EF estimated at 40%.  3. Normal LVEDP  Plan; recommend medical management. It is unclear from old notes if PDA occlusion is old or more recent. Would need to review films from [facility] to know. At any rate this vessel is too small for PCI and is collateralized.  _____________   History of Present Illness     Larry Warner is a 63 y.o. 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  He was in his usual state of health until 09/10/2019 when experienced a stressful situation at work and subsequently  developed burning throughout his chest similar to prior MI. He presented to the [facility] where he was given SL nitro and pain resolved. Troponins were negative and EKG non-ischemic. He underwent a NST which showed moderate to large areas of infarction involving the anterior, apical, and inferior walls, with probable peri-infarct ischemia in the distal inferolateral wall, and EF 34% (previously 45-50% 05/2019). He was evaluated by Dr. Bettina Gavia [facility]) and recommended for transfer to [facility] for cardiac catheterization to further evaluate his coronary anatomy.  Hospital Course     Consultants: None   1. Unstable angina in patient with CAD s/p PCI/DES to LAD and LCx 12/2017: patient presented to [facility] with burning chest pain reminiscent of prior MI. Troponins were negative and EKG non-ischemic. NST at [facility] showed reduced EF 34% and extensive scar and peri-infarct ischemia. He was seen by Dr. Bettina Gavia and decision made to transfer patient to [facility] for cardiac catheterization. LHC revealed patent LAD and LCx stent with 100% stenosis of ramus lesion and LPAV lesion, and EF 40%. Recommended for medical management. He was started on imdur 30mg  daily - Continue aspirin and statin - Continue carvedilol  - Continue imdur - PRN SL nitro rx renewed  2. Ischemic cardiomyopathy: EF 34% on NST at [facility], previously 45-50% 05/2019. Reportedly had a rash with losartan.  - Can consider repeat echocardiogram outpatient - Continue carvedilol - Can consider additional medications pending room  in BP  3. HTN: BP appears stable - Continue carvedilol  4. HLD: LDL 41 09/12/2019 - Continue atorvastatin 80mg  daily  Did the patient have an acute coronary syndrome (MI, NSTEMI, STEMI, etc) this admission?: No                        Did the patient have a percutaneous coronary intervention (stent / angioplasty)?:  No.   _____________  Discharge Vitals Blood  pressure 116/78, pulse 72, temperature 98 F (36.7 C), temperature source Oral, resp. rate 16, SpO2 95 %.  There were no vitals filed for this visit.  Labs & Radiologic Studies    CBC No results for input(s): WBC, NEUTROABS, HGB, HCT, MCV, PLT in the last 72 hours. Basic Metabolic Panel No results for input(s): NA, K, CL, CO2, GLUCOSE, BUN, CREATININE, CALCIUM, MG, PHOS in the last 72 hours. Liver Function Tests No results for input(s): AST, ALT, ALKPHOS, BILITOT, PROT, ALBUMIN in the last 72 hours. No results for input(s): LIPASE, AMYLASE in the last 72 hours. High Sensitivity Troponin:   No results for input(s): TROPONINIHS in the last 720 hours.  BNP Invalid input(s): POCBNP D-Dimer No results for input(s): DDIMER in the last 72 hours. Hemoglobin A1C No results for input(s): HGBA1C in the last 72 hours. Fasting Lipid Panel No results for input(s): CHOL, HDL, LDLCALC, TRIG, CHOLHDL, LDLDIRECT in the last 72 hours. Thyroid Function Tests No results for input(s): TSH, T4TOTAL, T3FREE, THYROIDAB in the last 72 hours.  Invalid input(s): FREET3 _____________  No results found. Disposition   Pt is being discharged home today in good condition.  Follow-up Plans & Appointments    Follow-up Information    Loel Dubonnet, NP Follow up on 09/23/2019.   Specialty: Cardiology Why: Please arrive 15 minutes early for your 10:00am post-hospital cardiology follow-up appointment. You will see a nurse practitioner who works with Dr. Bettina Gavia in the [facility] office but will follow in the [place] office going forward.  Contact information: [place] [address] [phone]            Discharge Medications   Allergies as of 09/13/2019   Not on File     Medication List    STOP taking these medications   ticagrelor 90 MG Tabs tablet Commonly known as: BRILINTA     TAKE these medications   aspirin 81 MG chewable tablet Chew by mouth daily.    atorvastatin 80 MG tablet Commonly known as: LIPITOR Take 80 mg by mouth daily.   carvedilol 6.25 MG tablet Commonly known as: COREG Take 6.25 mg by mouth 2 (two) times daily with a meal.   celecoxib 200 MG capsule Commonly known as: CELEBREX Take 200 mg by mouth 2 (two) times daily.   glipiZIDE 5 MG tablet Commonly known as: GLUCOTROL Take by mouth daily before breakfast.   isosorbide mononitrate 30 MG 24 hr tablet Commonly known as: IMDUR Take 1 tablet (30 mg total) by mouth daily.   metFORMIN 850 MG tablet Commonly known as: GLUCOPHAGE Take 850 mg by mouth 2 (two) times daily with a meal. Notes to patient: DO NOT TAKE until 09/16/2019. Then resume as previously prescribed.    nitroGLYCERIN 0.4 MG SL tablet Commonly known as: Nitrostat Place 1 tablet (0.4 mg total) under the tongue every 5 (five) minutes as needed for chest pain.   tolterodine 4 MG 24 hr capsule Commonly known as: DETROL LA Take  4 mg by mouth daily.          Outstanding Labs/Studies   Consider outpatient echocardiogram  Duration of Discharge Encounter   Greater than 30 minutes including physician time.  Signed, Abigail Butts, PA-C 09/13/2019, 3:37 PM

## 2019-09-13 NOTE — H&P (Signed)
Cardiology Admission History and Physical:   Patient ID: Larry Warner MRN: [id number]; DOB: 09/10/1956   Admission date: 09/13/2019  Primary Care Provider: [name], MD Primary Cardiologist: New to [place]; Dr. Bettina Gavia Primary Electrophysiologist:  None   Chief Complaint:  Chest pain  Patient Profile:   Larry Warner is a 63 y.o. 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    History of Present Illness:   Larry Warner was in his usual state of health until 09/10/2019 when experienced a stressful situation at work and subsequently developed burning throughout his chest similar to prior MI. He presented to the [facility] where he was given SL nitro and pain resolved. Troponins were negative and EKG non-ischemic. He underwent a NST which showed moderate to large areas of infarction involving the anterior, apical, and inferior walls, with probable peri-infarct ischemia in the distal inferolateral wall, and EF 34% (previously 45-50% 05/2019). He was evaluated by Dr. Bettina Gavia [facility]) and recommended for transfer to [facility] for cardiac catheterization to further evaluate his coronary anatomy.  Heart Pathway Score:     Past Medical History:  Diagnosis Date  . Family history of colonic polyps   . Family history of FAP (familial adenomatous polyposis)   . GERD (gastroesophageal reflux disease)   . Hyperlipidemia     Past Surgical History:  Procedure Laterality Date  . 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 ESOPHAGOGASTRODUODENOSCOPY  01/04/2017   Mild gastrtis. Otherwise normal EGD with convetional scope and ERCP scope. No periampullary tumors.   . Laprascopic assisted subtotal colectomy    . WRIST SURGERY       Medications Prior to Admission: Prior to Admission medications   Medication Sig Start Date End  Date Taking? Authorizing Provider  aspirin 81 MG chewable tablet Chew by mouth daily.    [provider]  atorvastatin (LIPITOR) 80 MG tablet Take 80 mg by mouth daily.    [provider]  carvedilol (COREG) 6.25 MG tablet Take 6.25 mg by mouth 2 (two) times daily with a meal.    [provider]  celecoxib (CELEBREX) 200 MG capsule Take 200 mg by mouth 2 (two) times daily.    [provider]  glipiZIDE (GLUCOTROL) 5 MG tablet Take by mouth daily before breakfast.    [provider]  losartan (COZAAR) 25 MG tablet Take 25 mg by mouth daily.    [provider]  metFORMIN (GLUCOPHAGE) 850 MG tablet Take 850 mg by mouth 2 (two) times daily with a meal.    [provider]  ticagrelor (BRILINTA) 90 MG TABS tablet Take by mouth daily.    [provider]  tolterodine (DETROL LA) 4 MG 24 hr capsule Take 4 mg by mouth daily.    [provider]     Allergies:   Not on File  Social History:   Social History   Socioeconomic History  . Marital status: Married    Spouse name: Not on file  . Number of children: Not on file  . Years of education: Not on file  . Highest education level: Not on file  Occupational History  . Not on file  Social Needs  . Financial resource strain: Not on file  . Food insecurity    Worry: Not on file    Inability:  Not on file  . Transportation needs    Medical: Not on file    Non-medical: Not on file  Tobacco Use  . Smoking status: Former Research scientist (life sciences)  . Smokeless tobacco: Never Used  Substance and Sexual Activity  . Alcohol use: Yes    Frequency: Never    Comment: occ  . Drug use: Never  . Sexual activity: Not on file  Lifestyle  . Physical activity    Days per week: Not on file    Minutes per session: Not on file  . Stress: Not on file  Relationships  . Social Herbalist on phone: Not on file    Gets together: Not on file    Attends religious service: Not on file     Active member of club or organization: Not on file    Attends meetings of clubs or organizations: Not on file    Relationship status: Not on file  . Intimate partner violence    Fear of current or ex partner: Not on file    Emotionally abused: Not on file    Physically abused: Not on file    Forced sexual activity: Not on file  Other Topics Concern  . Not on file  Social History Narrative  . Not on file    Family History:   The patient's family history includes Colon polyps in his brother, mother, and sister; Colon polyps (age of onset: 3) in his son; Familial polyposis in his son. There is no history of Colon cancer.    ROS:  Please see the history of present illness.  All other ROS reviewed and negative.     Physical Exam/Data:   Vitals:   09/13/19 1325  BP: 115/68  Pulse: 64  Resp: 12  SpO2: 99%   No intake or output data in the 24 hours ending 09/13/19 1352 Last 3 Weights 06/27/2019 05/22/2019  Weight (lbs) 205 lb 205 lb  Weight (kg) 92.987 kg 92.987 kg     There is no height or weight on file to calculate BMI.  General:  Well nourished, well developed, laying on cath lab table in no acute distress HEENT: sclera anicteric Neck: no JVD Vascular: No carotid bruits; distal pulses 2+ bilaterally Cardiac:  normal S1, S2; RRR; no murmurs, rubs, or gallops; right radial cath site with TR band in place Lungs:  clear to auscultation bilaterally, no wheezing, rhonchi or rales  Abd: soft, obese, nontender, no hepatomegaly  Ext: no edema Musculoskeletal:  No deformities, BUE and BLE strength normal and equal Skin: warm and dry  Neuro:  CNs 2-12 intact, no focal abnormalities noted Psych:  Normal affect    EKG:  NSR, rate 82 bpm, no STE/D, no TWI (initial EKG from stress test - no others available to review)  Relevant CV Studies: Left heart catheterization 01/04/2019: LMCA: Normal appearance with 0% stenosis. LAD: Normal appearance with 0% stenosis. LCx:  Lesion on Mid  CX: Mid subsection.85% stenosis 8 mm length reduced to 0%.  Pre procedure TIMI III flow was noted. Post Procedure TIMI III flow was  present. Good run off was present. The lesion was diagnosed as Moderate  Risk (B).  Devices used  - Abbott 0.014" x 190cm BMW J-Tip PTCI Guidewire. Number of passes: 1.  - 3.5 mm x 15 mm Xience Sierra DES Stent. 2 inflation(s) to a max  pressure of: 14 atm.  - 3.75x50m [place] Euphora. 2 inflation(s) to a max pressure of: 14 atm.  Lesion on Prox CX: Mid subsection.20% stenosis 5 mm length . RCA: Normal appearance with 0% stenosis.  Echocardiogram 05/2019: Mildly reduced LV systolic function with a large area of septal, anteroseptal and inferoseptal thinning and akinesis. LVEF estimated at 45-50% There is aortic sclerosis noted, with no evidence of stenosis.  NST 09/11/2019: Impression: 1. Moderate to large areas of infarction involving the anterior, apical, and inferior walls. Probable peri-infarct ischemia noted in the distal inferolateral wall 2. Left ventricular dilation, with hypokinesis involving the anterior, septal, and inferior walls 3. Left ventricular ejection fraction 34% 4. Non invasive risk stratification*: High  Laboratory Data:  High Sensitivity Troponin:  No results for input(s): TROPONINIHS in the last 720 hours.    ChemistryNo results for input(s): NA, K, CL, CO2, GLUCOSE, BUN, CREATININE, CALCIUM, GFRNONAA, GFRAA, ANIONGAP in the last 168 hours.  No results for input(s): PROT, ALBUMIN, AST, ALT, ALKPHOS, BILITOT in the last 168 hours. HematologyNo results for input(s): WBC, RBC, HGB, HCT, MCV, MCH, MCHC, RDW, PLT in the last 168 hours. BNPNo results for input(s): BNP, PROBNP in the last 168 hours.  DDimer No results for input(s): DDIMER in the last 168 hours.   Radiology/Studies:  No results found.  Assessment and Plan:   1. Unstable angina in patient with CAD s/p PCI/DES to LAD and LCx 12/2017: patient presented to [facility] with burning chest pain reminiscent of prior MI. Troponins were negative and EKG non-ischemic. NST at [facility] showed reduced EF 34% and extensive scar and peri-infarct ischemia. He was seen by Dr. Bettina Gavia and decision made to transfer patient to [facility] for cardiac catheterization. - Plan for LHC today - Continue aspirin - Continue carvedilol  - Continue statin  2. Ischemic cardiomyopathy: EF 34% on NST at [facility], previously 45-50% 05/2019. Reportedly had a rash with losartan.  - Can consider repeat echocardiogram outpatient - Continue carvedilol - Can consider additional medications pending room in BP  3. HTN: BP appears stable - Continue carvedilol  4. HLD: LDL 41 09/12/2019 - Continue atorvastatin 45m daily   Severity of Illness: The appropriate patient status for this patient is OBSERVATION. Observation status is judged to be reasonable and necessary in order to provide the required intensity of service to ensure the patient's safety. The patient's presenting symptoms, physical exam findings, and initial radiographic and laboratory data in the context of their medical condition is felt to place them at decreased risk for further clinical deterioration. Furthermore, it is anticipated that the patient will be medically stable for discharge from the hospital within 2 midnights of admission. The following factors support the patient status of observation.   " The patient's presenting symptoms include chest pain. " The physical exam findings include benign cardiopulmonary exam. " The initial radiographic and laboratory data are an abnormal nuclear stress test.     For questions or updates, please contact [facility] consult [email or web address] for contact info under        Signed, KAbigail Butts PA-C  09/13/2019 1:52 PM

## 2019-09-13 NOTE — Discharge Instructions (Signed)
Radial Site Care ° °This sheet gives you information about how to care for yourself after your procedure. Your health care provider may also give you more specific instructions. If you have problems or questions, contact your health care provider. °What can I expect after the procedure? °After the procedure, it is common to have: °· Bruising and tenderness at the catheter insertion area. °Follow these instructions at home: °Medicines °· Take over-the-counter and prescription medicines only as told by your health care provider. °Insertion site care °· Follow instructions from your health care provider about how to take care of your insertion site. Make sure you: °? Wash your hands with soap and water before you change your bandage (dressing). If soap and water are not available, use hand sanitizer. °? Change your dressing as told by your health care provider. °? Leave stitches (sutures), skin glue, or adhesive strips in place. These skin closures may need to stay in place for 2 weeks or longer. If adhesive strip edges start to loosen and curl up, you may trim the loose edges. Do not remove adhesive strips completely unless your health care provider tells you to do that. °· Check your insertion site every day for signs of infection. Check for: °? Redness, swelling, or pain. °? Fluid or blood. °? Pus or a bad smell. °? Warmth. °· Do not take baths, swim, or use a hot tub until your health care provider approves. °· You may shower 24-48 hours after the procedure, or as directed by your health care provider. °? Remove the dressing and gently wash the site with plain soap and water. °? Pat the area dry with a clean towel. °? Do not rub the site. That could cause bleeding. °· Do not apply powder or lotion to the site. °Activity ° °· For 24 hours after the procedure, or as directed by your health care provider: °? Do not flex or bend the affected arm. °? Do not push or pull heavy objects with the affected arm. °? Do not  drive yourself home from the hospital or clinic. You may drive 24 hours after the procedure unless your health care provider tells you not to. °? Do not operate machinery or power tools. °· Do not lift anything that is heavier than 10 lb (4.5 kg), or the limit that you are told, until your health care provider says that it is safe. °· Ask your health care provider when it is okay to: °? Return to work or school. °? Resume usual physical activities or sports. °? Resume sexual activity. °General instructions °· If the catheter site starts to bleed, raise your arm and put firm pressure on the site. If the bleeding does not stop, get help right away. This is a medical emergency. °· If you went home on the same day as your procedure, a responsible adult should be with you for the first 24 hours after you arrive home. °· Keep all follow-up visits as told by your health care provider. This is important. °Contact a health care provider if: °· You have a fever. °· You have redness, swelling, or yellow drainage around your insertion site. °Get help right away if: °· You have unusual pain at the radial site. °· The catheter insertion area swells very fast. °· The insertion area is bleeding, and the bleeding does not stop when you hold steady pressure on the area. °· Your arm or hand becomes pale, cool, tingly, or numb. °These symptoms may represent a serious problem   that is an emergency. Do not wait to see if the symptoms will go away. Get medical help right away. Call your local emergency services (911 in the [place] Do not drive yourself to the hospital. Summary  After the procedure, it is common to have bruising and tenderness at the site.  Follow instructions from your health care provider about how to take care of your radial site wound. Check the wound every day for signs of infection.  Do not lift anything that is heavier than 10 lb (4.5 kg), or the limit that you are told, until your health care provider says  that it is safe. This information is not intended to replace advice given to you by your health care provider. Make sure you discuss any questions you have with your health care provider. Document Released: 12/24/2010 Document Revised: 12/27/2017 Document Reviewed: 12/27/2017 Elsevier Patient Education  2020 [place] TO BRING ALL OF YOUR MEDICATIONS TO EACH OF YOUR FOLLOW-UP OFFICE VISITS.  PLEASE ATTEND ALL SCHEDULED FOLLOW-UP APPOINTMENTS.   Activity: Increase activity slowly as tolerated. You may shower, but no soaking baths (or swimming) for 1 week. No driving for 24 hours. No lifting over 5 lbs for 1 week. No sexual activity for 1 week.   You May Return to Work: once cleared by cardiology at your follow-up visit 09/23/2019  Wound Care: You may wash cath site gently with soap and water. Keep cath site clean and dry. If you notice pain, swelling, bleeding or pus at your cath site, please call [phone].   Medication changes: - START imdur (isosorbide mononitrate) 30 mg daily  You can resume your metformin 09/16/2019 as previously prescribed.

## 2019-09-16 ENCOUNTER — Encounter (HOSPITAL_COMMUNITY): Payer: Self-pay | Admitting: Cardiology

## 2019-09-23 ENCOUNTER — Other Ambulatory Visit: Payer: Self-pay

## 2019-09-23 ENCOUNTER — Encounter: Payer: Self-pay | Admitting: Family

## 2019-09-23 ENCOUNTER — Ambulatory Visit (INDEPENDENT_AMBULATORY_CARE_PROVIDER_SITE_OTHER): Payer: Managed Care, Other (non HMO) | Admitting: Family

## 2019-09-23 VITALS — BP 130/78 | HR 73 | Temp 97.2°F | Ht 68.0 in | Wt 216.0 lb

## 2019-09-23 DIAGNOSIS — I10 Essential (primary) hypertension: Secondary | ICD-10-CM

## 2019-09-23 DIAGNOSIS — I255 Ischemic cardiomyopathy: Secondary | ICD-10-CM | POA: Diagnosis not present

## 2019-09-23 DIAGNOSIS — I2510 Atherosclerotic heart disease of native coronary artery without angina pectoris: Secondary | ICD-10-CM | POA: Diagnosis not present

## 2019-09-23 DIAGNOSIS — E782 Mixed hyperlipidemia: Secondary | ICD-10-CM | POA: Diagnosis not present

## 2019-09-23 NOTE — Progress Notes (Signed)
Office Visit    Patient Name: Larry Warner Date of Encounter: 09/23/2019  Primary Care Provider:  [name], MD Primary Cardiologist:  [name], MD Electrophysiologist:  None   Chief Complaint    Larry Warner is a 63 y.o. male with a hx of CAD (s/p anterior STEMI with PCI/DES to LAD and LCx 12/2017), ICM (EF 45-50% 05/2019), HTN, HLD, DM2 presents today for follow up after hospitalization.   Past Medical History    Past Medical History:  Diagnosis Date   Family history of colonic polyps    Family history of FAP (familial adenomatous polyposis)    GERD (gastroesophageal reflux disease)    Hyperlipidemia    Past Surgical History:  Procedure Laterality Date   Attenuated FAP s/p laproscopic assisted subtotal colectomy with ileo recatal anastomosis     ESOPHAGOGASTRODUODENOSCOPY  01/04/2017   Mild gastrtis. Otherwise normal EGD with convetional scope and ERCP scope. No periampullary tumors.    Laprascopic assisted subtotal colectomy     LEFT HEART CATH AND CORONARY ANGIOGRAPHY N/A 09/13/2019   Procedure: LEFT HEART CATH AND CORONARY ANGIOGRAPHY;  Surgeon: Martinique, Peter M, MD;  Location: [facility];  Service: Cardiovascular;  Laterality: N/A;   WRIST SURGERY      Allergies  Allergies  Allergen Reactions   Losartan Rash    History of Present Illness    Larry Warner is a 63 y.o. male with a hx of CAD (s/p anterior STEMI with PCI/DES to LAD and LCx 12/2017), ICM (EF 45-50% 05/2019), HTN, HLD, DM2 last seen while hospitalized.   He presented to [facility] ED 09/10/19 after stressful situation at work precipitated chest pain similar to his anginal equivalent. Tells me he took 1 Nitroglycerin at work and the chest pain resolved, but his supervisors encouraged him to go to the ED. Troponin negative, EKG non-ischemic, stress test with moderate to large areas of infarction in the anterior, apical, and inferior walls concerning for probably peri-infarct  ischemia in the distal inferolateral wall and EF 34%.   Transferred to [facility] for cardiac catheterization. Cath 09/13/19 with patent stent in prox LAD and mid LCx, 100% prox L PDA with collaterals and small caliber vessel. Recommended for medical management. Unclear if PDA occlusion is old or recent, but too small for PCI and collateralized.   He reports feeling well since discharge. No recurrent chest pain. We discussed his cath report in detail and how Imdur will help prevent anginal symptoms. He has tolerated Imdur well. Has not been checking BP at home, but agreeable to start.   He does not that three days ago he had new onset of some swelling in his R jaw that has moved up to near his ear. Tells me it is itchy and somewhat uncomfortable. Denies tooth pain or trouble eating. On exam appears he may have a swollen parotid gland, recommended to see his PCP. He has an appointment for this afternoon.   Tells me he told his wife prior to this hospitalization he needed to resume his exercise regimen. He did complete cardiac rehab at [facility] after his stent 12/2017 and plans to use his treadmill at home to complete similar exercises. Endorses eating a heart healthy diet and avoiding salt.   EKGs/Labs/Other Studies Reviewed:   The following studies were reviewed today: Echo 05/16/19 Findings Mitral Valve Mild mitral annular calcification. No significant mitral regurgitation. Aortic Valve There is aortic sclerosis noted, with no evidence of stenosis. No aortic stenosis.  No aortic regurgitation. Tricuspid Valve Structurally normal tricuspid valve with no stenosis. Pulmonic Valve Normal pulmonic valve structure and mobility. Left Atrium Normal left atrium. Left Ventricle Mildly reduced LV systolic function with a large area of septal, anteroseptal and inferoseptal thinning and akinesis. LVEF estimated at 45-50% Right Atrium Normal right atrium. Right Ventricle Normal right ventricle  structure and function. Pericardial Effusion No evidence of pericardial effusion. Pleural Effusion No evidence of pleural effusion. Miscellaneous The aorta is within normal limits. The Pulmonary artery is within normal limits. Intact interatrial septum with no obvious shunt by color doppler. IVC is normal and collapses  Lexiscan 09/12/2019 IMPRESSION: 1. Moderate to large areas of infarction involving the anterior, apical, and inferior walls. Probable peri-infarct ischemia noted in the distal inferolateral wall.  2. Left ventricular dilatation, with hypokinesis involving the anterior, septal, and inferior walls..  3. Left ventricular ejection fraction 34%  4. Non invasive risk stratification*: High  Cardiac cath 09/13/19  Previously placed Prox LAD to Mid LAD stent (unknown type) is widely patent.  Previously placed Prox Cx to Dist Cx stent (unknown type) is widely patent.  Ramus lesion is 100% stenosed.  LPAV lesion is 100% stenosed with 100% stenosed side branch in LPDA.  There is moderate left ventricular systolic dysfunction.  LV end diastolic pressure is normal.  The left ventricular ejection fraction is 35-45% by visual estimate.   1. CAD. Left dominant circulation    - Widely patent stent in the proximal LAD    - Widely patent stent in the mid LCx    - a tiny, wispy intermediate branch is occluded    - 100% proximal left PDA. There are collaterals to this vessel. The vessel is small in caliber 2. Moderate LV dysfunction. EF estimated at 40%.  3. Normal LVEDP   Plan; recommend medical management. It is unclear from old notes if PDA occlusion is old or more recent. Would need to review films from [facility] to know. At any rate this vessel is too small for PCI and is collateralized.   EKG:  No EKG today.  Recent Labs: No results found for requested labs within last 8760 hours.  Recent Lipid Panel No results found for: CHOL, TRIG, HDL, CHOLHDL, VLDL, LDLCALC,  LDLDIRECT  Home Medications   Current Meds  Medication Sig   aspirin 81 MG chewable tablet Chew by mouth daily.   atorvastatin (LIPITOR) 80 MG tablet Take 80 mg by mouth daily.   carvedilol (COREG) 6.25 MG tablet Take 6.25 mg by mouth 2 (two) times daily with a meal.   celecoxib (CELEBREX) 200 MG capsule Take 200 mg by mouth 2 (two) times daily.   glipiZIDE (GLUCOTROL) 5 MG tablet Take by mouth daily before breakfast.   isosorbide mononitrate (IMDUR) 30 MG 24 hr tablet Take 1 tablet (30 mg total) by mouth daily.   metFORMIN (GLUCOPHAGE) 850 MG tablet Take 850 mg by mouth every morning.    nitroGLYCERIN (NITROSTAT) 0.4 MG SL tablet Place 1 tablet (0.4 mg total) under the tongue every 5 (five) minutes as needed for chest pain.   tolterodine (DETROL LA) 4 MG 24 hr capsule Take 4 mg by mouth daily.    Review of Systems      Review of Systems  Constitution: Negative for chills, fever and malaise/fatigue.  HENT:       R cheek swelling  Cardiovascular: Negative for chest pain, dyspnea on exertion, irregular heartbeat, leg swelling, near-syncope, orthopnea and palpitations.  Respiratory: Negative for  cough, shortness of breath and wheezing.   Gastrointestinal: Negative for nausea and vomiting.   All other systems reviewed and are otherwise negative except as noted above.  Physical Exam    VS:  BP 130/78 (BP Location: Left Arm, Patient Position: Sitting, Cuff Size: Large)    Pulse 73    Temp (!) 97.2 F (36.2 C) (Temporal)    Ht 5\' 8"  (1.727 m)    Wt 216 lb (98 kg)    SpO2 96%    BMI 32.84 kg/m  , BMI Body mass index is 32.84 kg/m. GEN: Well nourished, well developed, in no acute distress. HEENT: Mobile area of swelling to R cheek near corner of mandible approx 1"x1". Skin mildly reddened. No breaks noted to skin integrity. Neck: Supple, no JVD, carotid bruits, or masses. Cardiac: RRR, no murmurs, rubs, or gallops. No clubbing, cyanosis, edema.  Radials/DP/PT 2+ and equal  bilaterally.  Respiratory:  Respirations regular and unlabored, clear to auscultation bilaterally. GI: Soft, nontender, nondistended, BS + x 4. MS: No deformity or atrophy. Skin: Warm and dry, no rash. Neuro:  Strength and sensation are intact. Psych: Normal affect.   Assessment & Plan    1. CAD - Stable, no anginal symptoms. S/p PCI/DES to LAD, Cx 12/2017 - completed 18 months of therapy with Brilinta post stent. R radial cath site clean, dry, intact and healing appropriately. Continue GDMT aspirin, beta blocker, statin, Imdur, PRN nitroglycerin. Discussed utility of nitroglycerin and that he can call our office if he takes nitroglycerin, but does not need to present to the ED unless he has chest pain unrelieved by 2-3 nitroglycerin. He will resume exercise regimen on treadmill. Continue low sodium, heart healthy diet.   2. Ischemic cardiomyopathy - Cath 12/2017 with EF 45% > Echo 05/2019 EF 45-50% >  Lexiscan 09/12/19 EF 34% > cath 09/13/19 EF 40%. He reports no SOB, DOE, edema - euvolemic on exam. GDMT beta blocker, Imdur - he will record BP if BP allows consider optimization of medical therapies. Consider addition of ARB (previous documentation notes rash with Losartan - will need to inquire further) vs up-titration of Imdur/Coreg.  3. HTN - BP well controlled today. He will resume checking at least 3 times per week and keep a log. Will report BP consistently >130/80.  4. HLD, LDL goal <70 - Lipid panel 09/12/19 LDL 41 Continue Atorvastatin 80mg  daily.   Disposition: Follow up in 6 week(s) with Dr. Bettina Gavia.   Loel Dubonnet, NP 09/23/2019, 4:37 PM

## 2019-09-23 NOTE — Patient Instructions (Signed)
Medication Instructions:  No medication changes today.  *If you need a refill on your cardiac medications before your next appointment, please call your pharmacy*  Lab Work: No lab work today.   If you have labs (blood work) drawn today and your tests are completely normal, you will receive your results only by: Marland Kitchen MyChart Message (if you have MyChart) OR . A paper copy in the mail If you have any lab test that is abnormal or we need to change your treatment, we will call you to review the results.  Testing/Procedures: None today.   Follow-Up: At [facility], you and your health needs are our priority.  As part of our continuing mission to provide you with exceptional heart care, we have created designated Provider Care Teams.  These Care Teams include your primary Cardiologist (physician) and Advanced Practice Providers (APPs -  Physician Assistants and Nurse Practitioners) who all work together to provide you with the care you need, when you need it.  Your next appointment:   6 weeks  The format for your next appointment:   In Person  Provider:   [name], MD  Other Instructions   Our goal BP is <130/80. If you consistently get systolic (top number) readings above 130, please call our office.   Our goal LDL ("bad cholesterol") is less than 70. Your labs in July were within this goal!  Our goal A1C is <7. Your labs in July were within this goal!  Tips to Measure your Blood Pressure Correctly  To determine whether you have hypertension, a medical professional will take a blood pressure reading. How you prepare for the test, the position of your arm, and other factors can change a blood pressure reading by 10% or more. That could be enough to hide high blood pressure, start you on a drug you don't really need, or lead your doctor to incorrectly adjust your medications.  National and international guidelines offer specific instructions for measuring blood pressure. If a  doctor, nurse, or medical assistant isn't doing it right, don't hesitate to ask him or her to get with the guidelines.  Here's what you can do to ensure a correct reading: . Don't drink a caffeinated beverage or smoke during the 30 minutes before the test. . Sit quietly for five minutes before the test begins. . During the measurement, sit in a chair with your feet on the floor and your arm supported so your elbow is at about heart level. . The inflatable part of the cuff should completely cover at least 80% of your upper arm, and the cuff should be placed on bare skin, not over a shirt. . Don't talk during the measurement. . Have your blood pressure measured twice, with a brief break in between. If the readings are different by 5 points or more, have it done a third time.  There are times to break these rules. If you sometimes feel lightheaded when getting out of bed in the morning or when you stand after sitting, you should have your blood pressure checked while seated and then while standing to see if it falls from one position to the next.  Because blood pressure varies throughout the day, your doctor will rarely diagnose hypertension on the basis of a single reading. Instead, he or she will want to confirm the measurements on at least two occasions, usually within a few weeks of one another. The exception to this rule is if you have a blood pressure reading of  180/110 mm Hg or higher. A result this high usually calls for prompt treatment.  It's a good idea to have your blood pressure measured in both arms at least once, since the reading in one arm (usually the right) may be higher than that in the left. A 2014 study in The American Journal of Medicine of nearly 3,400 people found average arm- to-arm differences in systolic blood pressure of about 5 points. The higher number should be used to make treatment decisions.  In 2017, new guidelines from the [place], the [employer] of Cardiology, and nine other health organizations lowered the diagnosis of high blood pressure to 130/80 mm Hg or higher for all adults. The guidelines also redefined the various blood pressure categories to now include normal, elevated, Stage 1 hypertension, Stage 2 hypertension, and hypertensive crisis (see "Blood pressure categories").  Blood pressure categories  Blood pressure category SYSTOLIC (upper number)  DIASTOLIC (lower number)  Normal Less than 120 mm Hg and Less than 80 mm Hg  Elevated 120-129 mm Hg and Less than 80 mm Hg  High blood pressure: Stage 1 hypertension 130-139 mm Hg or 80-89 mm Hg  High blood pressure: Stage 2 hypertension 140 mm Hg or higher or 90 mm Hg or higher  Hypertensive crisis (consult your doctor immediately) Higher than 180 mm Hg and/or Higher than 120 mm Hg  Source: American Heart Association and American Stroke Association. For more on getting your blood pressure under control, buy Controlling Your Blood Pressure, a Special Health Report from [facility].   Blood Pressure Log   Date   Time  Blood Pressure  Position  Example: Nov 1 9 AM 124/78 sitting

## 2019-09-25 ENCOUNTER — Telehealth: Payer: Self-pay | Admitting: Family

## 2019-09-25 NOTE — Telephone Encounter (Signed)
Larry Warner called with regards to his clearance to return to work. HR department has requested clarification regarding his length of shift. Will fax letter to ATTN: Larry Warner at [phone] for Larry Warner to work 8 hour shifts x2 weeks then return to 12 hour shifts.   Larry Dubonnet, NP

## 2019-09-25 NOTE — Telephone Encounter (Signed)
The patient was seen by Laurann Montana on Monday 09-23-19 in regards to a release to go back to work. The HR dept at the patient's work will need some further clarification, and the patient had a couple more questions. The patient dialed the number for the [place] office. I provided him with the direct number for the [facility] office for future calls

## 2019-09-25 NOTE — Telephone Encounter (Signed)
Please call human resources at [phone]

## 2019-09-25 NOTE — Telephone Encounter (Signed)
See separate telephone encounter from today as this is a duplicate. Work note faxed to his place of employment per his request.  Loel Dubonnet, NP

## 2019-11-06 NOTE — Progress Notes (Signed)
Cardiology Office Note:    Date:  11/07/2019   ID:  Larry Warner, DOB Mar 02, 1956, MRN [id number]  PCP:  [name], MD  Cardiologist:  [name], MD    Referring MD: [name], MD    ASSESSMENT:    1. Coronary artery disease involving native coronary artery of native heart without angina pectoris   2. Ischemic cardiomyopathy   3. Essential hypertension   4. Mixed hyperlipidemia    PLAN:    In order of problems listed above:  1. Recently presented with troponin negative unstable angina it peri-infarct ischemia and unfortunately did not have anatomy amenable to percutaneous or surgical revascularization on heart catheterization.  His ejection fraction is reduced 34% gated pool 35-45 by angiography and 45 by echocardiogram.  I was able to pull up labs on him he has normal renal function will place him on spironolactone 25 mg daily. 2. See above optimize therapy assess for ventricular arrhythmia 3. Well controlled continue beta-blocker additional therapy as needed hydralazine and isosorbide mononitrate 4. Lipid profile 09/18/2019 had an LDL of 53 continue current treatment   Next appointment: 1 month   Medication Adjustments/Labs and Tests Ordered: Current medicines are reviewed at length with the patient today.  Concerns regarding medicines are outlined above.  No orders of the defined types were placed in this encounter.  No orders of the defined types were placed in this encounter.   Chief Complaint  Patient presents with  . Coronary Artery Disease    History of Present Illness:    Larry Warner is a 63 y.o. male with a hx of  CAD (s/p anterior STEMI with PCI/DES to LAD and LCx 12/2017), ICM (EF 45-50% 05/2019), HTN, HLD, DM2 last seen 12/23/2018.  He presented to [facility] ED 09/10/19 after stressful situation at work precipitated chest pain similar to his anginal equivalent. Tells me he took 1 Nitroglycerin at work and the chest pain resolved, but his  supervisors encouraged him to go to the ED. Troponin negative, EKG non-ischemic, stress test with moderate to large areas of infarction in the anterior, apical, and inferior walls concerning for probably peri-infarct ischemia in the distal inferolateral wall and EF 34%.    Transferred to [facility] for cardiac catheterization. Cath 09/13/19 with patent stent in prox LAD and mid LCx, 100% prox L PDA with collaterals and small caliber vessel. Recommended for medical management. Unclear if PDA occlusion is old or recent, but too small for PCI and collateralized.   Compliance with diet, lifestyle and medications: Yes  I reviewed his testing with him.  Echo 05/16/19 Findings Mitral Valve Mild mitral annular calcification. No significant mitral regurgitation. Aortic Valve There is aortic sclerosis noted, with no evidence of stenosis. No aortic stenosis. No aortic regurgitation. Tricuspid Valve Structurally normal tricuspid valve with no stenosis. Pulmonic Valve Normal pulmonic valve structure and mobility. Left Atrium Normal left atrium. Left Ventricle Mildly reduced LV systolic function with a large area of septal, anteroseptal and inferoseptal thinning and akinesis. LVEF estimated at 45-50% Right Atrium Normal right atrium. Right Ventricle Normal right ventricle structure and function. Pericardial Effusion No evidence of pericardial effusion. Pleural Effusion No evidence of pleural effusion. Miscellaneous The aorta is within normal limits. The Pulmonary artery is within normal limits. Intact interatrial septum with no obvious shunt by color doppler. IVC is normal and collapses  Lexiscan 09/12/2019 IMPRESSION: 1. Moderate to large areas of infarction involving the anterior, apical, and inferior walls. Probable peri-infarct ischemia noted in the  distal inferolateral wall. 2. Left ventricular dilatation, with hypokinesis involving the anterior, septal, and inferior walls.. 3.  Left ventricular ejection fraction 34% 4. Non invasive risk stratification*: High  Cardiac cath 09/13/19  Previously placed Prox LAD to Mid LAD stent (unknown type) is widely patent.  Previously placed Prox Cx to Dist Cx stent (unknown type) is widely patent.  Ramus lesion is 100% stenosed.  LPAV lesion is 100% stenosed with 100% stenosed side branch in LPDA.  There is moderate left ventricular systolic dysfunction.  LV end diastolic pressure is normal.  The left ventricular ejection fraction is 35-45% by visual estimate.  He does not feel badly he just does not feel well and attributes much to the stress of his supervisor work.  He has had no angina edema shortness of breath chest pain palpitation or syncope.  I told him I think the best I can do for him is to optimize medical therapy.  I have advised him he should take dual antiplatelet and will add clopidogrel to aspirin we will optimize his antianginal therapy by placing him on ranolazine and with his cardiomyopathy initially I want to place him on Entresto but has had a rash with ARB.  We will check his labs at the hospital and renal function potassium is normal we will place him on spironolactone.  With his reduced ejection fraction CAD and old MI he will wear a 7-day ZIO monitor and if he has evidence of ventricular tachycardia ask him to consider an ICD Past Medical History:  Diagnosis Date  . Family history of colonic polyps   . Family history of FAP (familial adenomatous polyposis)   . GERD (gastroesophageal reflux disease)   . Hyperlipidemia     Past Surgical History:  Procedure Laterality Date  . 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 ESOPHAGOGASTRODUODENOSCOPY  01/04/2017   Mild gastrtis. Otherwise normal EGD with convetional scope and ERCP scope. No periampullary tumors.   . Laprascopic assisted subtotal colectomy    . LEFT HEART CATH AND CORONARY ANGIOGRAPHY N/A  09/13/2019   Procedure: LEFT HEART CATH AND CORONARY ANGIOGRAPHY;  Surgeon: Martinique, Peter M, MD;  Location: [facility];  Service: Cardiovascular;  Laterality: N/A;  . WRIST SURGERY      Current Medications: Current Meds  Medication Sig  . aspirin 81 MG chewable tablet Chew by mouth daily.  Marland Kitchen atorvastatin (LIPITOR) 80 MG tablet Take 80 mg by mouth daily.  . carvedilol (COREG) 6.25 MG tablet Take 6.25 mg by mouth 2 (two) times daily with a meal.  . celecoxib (CELEBREX) 200 MG capsule Take 200 mg by mouth 2 (two) times daily.  . cetirizine (ZYRTEC) 10 MG tablet Take 10 mg by mouth daily.  Marland Kitchen glipiZIDE (GLUCOTROL) 5 MG tablet Take by mouth daily before breakfast.  . isosorbide mononitrate (IMDUR) 30 MG 24 hr tablet Take 1 tablet (30 mg total) by mouth daily.  . metFORMIN (GLUCOPHAGE) 850 MG tablet Take 850 mg by mouth every morning.   . nitroGLYCERIN (NITROSTAT) 0.4 MG SL tablet Place 1 tablet (0.4 mg total) under the tongue every 5 (five) minutes as needed for chest pain.  Marland Kitchen omeprazole (PRILOSEC) 20 MG capsule Take 20 mg by mouth daily.  Marland Kitchen tolterodine (DETROL LA) 4 MG 24 hr capsule Take 4 mg by mouth daily.     Allergies:   Losartan   Social History   Socioeconomic History  . Marital status: Married  Spouse name: Not on file  . Number of children: Not on file  . Years of education: Not on file  . Highest education level: Not on file  Occupational History  . Not on file  Social Needs  . Financial resource strain: Not on file  . Food insecurity    Worry: Not on file    Inability: Not on file  . Transportation needs    Medical: Not on file    Non-medical: Not on file  Tobacco Use  . Smoking status: Former Smoker    Types: Cigarettes    Quit date: 12/05/2017    Years since quitting: 1.9  . Smokeless tobacco: Never Used  Substance and Sexual Activity  . Alcohol use: Yes    Frequency: Never    Comment: occ  . Drug use: Never  . Sexual activity: Not on file  Lifestyle   . Physical activity    Days per week: Not on file    Minutes per session: Not on file  . Stress: Not on file  Relationships  . Social Herbalist on phone: Not on file    Gets together: Not on file    Attends religious service: Not on file    Active member of club or organization: Not on file    Attends meetings of clubs or organizations: Not on file    Relationship status: Not on file  Other Topics Concern  . Not on file  Social History Narrative  . Not on file     Family History: The patient's family history includes Colon polyps in his brother, mother, and sister; Colon polyps (age of onset: 59) in his son; Familial polyposis in his son. There is no history of Colon cancer. ROS:   Please see the history of present illness.    All other systems reviewed and are negative.  EKGs/Labs/Other Studies Reviewed:    The following studies were reviewed today:  EKG:  EKG ordered today and personally reviewed.  The ekg ordered today demonstrates sinus rhythm right bundle branch block old anterior septal MI  Recent Labs: No results found for requested labs within last 8760 hours.  Recent Lipid Panel No results found for: CHOL, TRIG, HDL, CHOLHDL, VLDL, LDLCALC, LDLDIRECT  Physical Exam:    VS:  BP 124/80 (BP Location: Right Arm, Patient Position: Sitting, Cuff Size: Normal)   Pulse 76   Ht 5\' 8"  (1.727 m)   Wt 205 lb (93 kg)   SpO2 97%   BMI 31.17 kg/m     Wt Readings from Last 3 Encounters:  11/07/19 205 lb (93 kg)  09/23/19 216 lb (98 kg)  06/27/19 205 lb (93 kg)     GEN: He is a bit anxious well nourished, well developed in no acute distress HEENT: Normal NECK: No JVD; No carotid bruits LYMPHATICS: No lymphadenopathy CARDIAC: RRR, no murmurs, rubs, gallops RESPIRATORY:  Clear to auscultation without rales, wheezing or rhonchi  ABDOMEN: Soft, non-tender, non-distended MUSCULOSKELETAL:  No edema; No deformity  SKIN: Warm and dry NEUROLOGIC:  Alert and  oriented x 3 PSYCHIATRIC:  Normal affect    Signed, [name], MD  11/07/2019 4:13 PM    [facility]

## 2019-11-07 ENCOUNTER — Other Ambulatory Visit: Payer: Self-pay

## 2019-11-07 ENCOUNTER — Encounter: Payer: Self-pay | Admitting: *Deleted

## 2019-11-07 ENCOUNTER — Ambulatory Visit (INDEPENDENT_AMBULATORY_CARE_PROVIDER_SITE_OTHER): Payer: Managed Care, Other (non HMO)

## 2019-11-07 ENCOUNTER — Ambulatory Visit (INDEPENDENT_AMBULATORY_CARE_PROVIDER_SITE_OTHER): Payer: Managed Care, Other (non HMO) | Admitting: Cardiology

## 2019-11-07 VITALS — BP 124/80 | HR 76 | Ht 68.0 in | Wt 205.0 lb

## 2019-11-07 DIAGNOSIS — I2510 Atherosclerotic heart disease of native coronary artery without angina pectoris: Secondary | ICD-10-CM

## 2019-11-07 DIAGNOSIS — I10 Essential (primary) hypertension: Secondary | ICD-10-CM

## 2019-11-07 DIAGNOSIS — I255 Ischemic cardiomyopathy: Secondary | ICD-10-CM | POA: Diagnosis not present

## 2019-11-07 DIAGNOSIS — E782 Mixed hyperlipidemia: Secondary | ICD-10-CM

## 2019-11-07 MED ORDER — SPIRONOLACTONE 25 MG PO TABS: 25.0000 mg | ORAL_TABLET | Freq: Every day | ORAL | 3 refills | Status: DC

## 2019-11-07 MED ORDER — CLOPIDOGREL BISULFATE 75 MG PO TABS: 75.0000 mg | ORAL_TABLET | Freq: Every day | ORAL | 3 refills | Status: DC

## 2019-11-07 MED ORDER — RANOLAZINE ER 500 MG PO TB12: 500.0000 mg | ORAL_TABLET | Freq: Two times a day (BID) | ORAL | 3 refills | Status: DC

## 2019-11-07 NOTE — Patient Instructions (Addendum)
Medication Instructions:  Your physician has recommended you make the following change in your medication:   START clopidogrel (plavix) 75 mg: Take 1 tablet daily START ranolazine (ranexa) 500 mg: Take 1 tablet twice daily START spironolactone (aldactone) 25 mg: Take 1 tablet daily   *If you need a refill on your cardiac medications before your next appointment, please call your pharmacy*  Lab Work: None  If you have labs (blood work) drawn today and your tests are completely normal, you will receive your results only by: Marland Kitchen MyChart Message (if you have MyChart) OR . A paper copy in the mail If you have any lab test that is abnormal or we need to change your treatment, we will call you to review the results.  Testing/Procedures: You had an EKG today.   Your physician has recommended that you wear a ZIO monitor. ZIO monitors are medical devices that record the heart's electrical activity. Doctors most often use these monitors to diagnose arrhythmias. Arrhythmias are problems with the speed or rhythm of the heartbeat. The monitor is a small, portable device. You can wear one while you do your normal daily activities. This is usually used to diagnose what is causing palpitations/syncope (passing out). Wear for 7 days.   Follow-Up: At [facility], you and your health needs are our priority.  As part of our continuing mission to provide you with exceptional heart care, we have created designated Provider Care Teams.  These Care Teams include your primary Cardiologist (physician) and Advanced Practice Providers (APPs -  Physician Assistants and Nurse Practitioners) who all work together to provide you with the care you need, when you need it.  Your next appointment:   4 week(s)  The format for your next appointment:   In Person  Provider:   [name], MD   Ranolazine tablets, extended release What is this medicine? RANOLAZINE (ra NOE la zeen) is a heart medicine. It is used to  treat chronic chest pain (angina). This medicine must be taken regularly. It will not relieve an acute episode of chest pain. This medicine may be used for other purposes; ask your health care provider or pharmacist if you have questions. COMMON BRAND NAME(S): Ranexa What should I tell my health care provider before I take this medicine? They need to know if you have any of these conditions:  heart disease  irregular heartbeat  kidney disease  liver disease  low levels of potassium or magnesium in the blood  an unusual or allergic reaction to ranolazine, other medicines, foods, dyes, or preservatives  pregnant or trying to get pregnant  breast-feeding How should I use this medicine? Take this medicine by mouth with a glass of water. Follow the directions on the prescription label. Do not cut, crush, or chew this medicine. Take with or without food. Do not take this medication with grapefruit juice. Take your doses at regular intervals. Do not take your medicine more often then directed. Talk to your pediatrician regarding the use of this medicine in children. Special care may be needed. Overdosage: If you think you have taken too much of this medicine contact a poison control center or emergency room at once. NOTE: This medicine is only for you. Do not share this medicine with others. What if I miss a dose? If you miss a dose, take it as soon as you can. If it is almost time for your next dose, take only that dose. Do not take double or extra doses. What may  interact with this medicine? Do not take this medicine with any of the following medications:  antivirals for HIV or AIDS  cerivastatin  certain antibiotics like chloramphenicol, clarithromycin, dalfopristin; quinupristin, isoniazid, rifabutin, rifampin, rifapentine  certain medicines used for cancer like imatinib, nilotinib  certain medicines for fungal infections like fluconazole, itraconazole, ketoconazole, posaconazole,  voriconazole  certain medicines for irregular heart beat like dronedarone  certain medicines for seizures like carbamazepine, fosphenytoin, oxcarbazepine, phenobarbital, phenytoin  cisapride  conivaptan  cyclosporine  grapefruit or grapefruit juice  lumacaftor; ivacaftor  nefazodone  pimozide  quinacrine  St John's wort  thioridazine This medicine may also interact with the following medications:  alfuzosin  certain medicines for depression, anxiety, or psychotic disturbances like bupropion, citalopram, fluoxetine, fluphenazine, paroxetine, perphenazine, risperidone, sertraline, trifluoperazine  certain medicines for cholesterol like atorvastatin, lovastatin, simvastatin  certain medicines for stomach problems like octreotide, palonosetron, prochlorperazine  eplerenone  ergot alkaloids like dihydroergotamine, ergonovine, ergotamine, methylergonovine  metformin  nicardipine  other medicines that prolong the QT interval (cause an abnormal heart rhythm) like dofetilide, ziprasidone  sirolimus  tacrolimus This list may not describe all possible interactions. Give your health care provider a list of all the medicines, herbs, non-prescription drugs, or dietary supplements you use. Also tell them if you smoke, drink alcohol, or use illegal drugs. Some items may interact with your medicine. What should I watch for while using this medicine? Visit your doctor for regular check ups. Tell your doctor or healthcare professional if your symptoms do not start to get better or if they get worse. This medicine will not relieve an acute attack of angina or chest pain. This medicine can change your heart rhythm. Your health care provider may check your heart rhythm by ordering an electrocardiogram (ECG) while you are taking this medicine. You may get drowsy or dizzy. Do not drive, use machinery, or do anything that needs mental alertness until you know how this medicine affects you.  Do not stand or sit up quickly, especially if you are an older patient. This reduces the risk of dizzy or fainting spells. Alcohol may interfere with the effect of this medicine. Avoid alcoholic drinks. If you are scheduled for any medical or dental procedure, tell your healthcare provider that you are taking this medicine. This medicine can interact with other medicines used during surgery. What side effects may I notice from receiving this medicine? Side effects that you should report to your doctor or health care professional as soon as possible:  allergic reactions like skin rash, itching or hives, swelling of the face, lips, or tongue  breathing problems  changes in vision  fast, irregular or pounding heartbeat  feeling faint or lightheaded, falls  low or high blood pressure  numbness or tingling feelings  ringing in the ears  tremor or shakiness  slow heartbeat (fewer than 50 beats per minute)  swelling of the legs or feet Side effects that usually do not require medical attention (report to your doctor or health care professional if they continue or are bothersome):  constipation  drowsy  dry mouth  headache  nausea or vomiting  stomach upset This list may not describe all possible side effects. Call your doctor for medical advice about side effects. You may report side effects to FDA at 1-800-FDA-1088. Where should I keep my medicine? Keep out of the reach of children. Store at room temperature between 15 and 30 degrees C (59 and 86 degrees F). Throw away any unused medicine after the expiration  date. NOTE: This sheet is a summary. It may not cover all possible information. If you have questions about this medicine, talk to your doctor, pharmacist, or health care provider.  2020 Elsevier/Gold Standard (2018-11-13 09:18:49)   Clopidogrel tablets What is this medicine? CLOPIDOGREL (kloh PID oh grel) helps to prevent blood clots. This medicine is used to prevent  heart attack, stroke, or other vascular events in people who are at high risk. This medicine may be used for other purposes; ask your health care provider or pharmacist if you have questions. COMMON BRAND NAME(S): Plavix What should I tell my health care provider before I take this medicine? They need to know if you have any of the following conditions:  bleeding disorders  bleeding in the brain  having surgery  history of stomach bleeding  an unusual or allergic reaction to clopidogrel, other medicines, foods, dyes, or preservatives  pregnant or trying to get pregnant  breast-feeding How should I use this medicine? Take this medicine by mouth with a glass of water. Follow the directions on the prescription label. You may take this medicine with or without food. If it upsets your stomach, take it with food. Take your medicine at regular intervals. Do not take it more often than directed. Do not stop taking except on your doctor's advice. A special MedGuide will be given to you by the pharmacist with each prescription and refill. Be sure to read this information carefully each time. Talk to your pediatrician regarding the use of this medicine in children. Special care may be needed. Overdosage: If you think you have taken too much of this medicine contact a poison control center or emergency room at once. NOTE: This medicine is only for you. Do not share this medicine with others. What if I miss a dose? If you miss a dose, take it as soon as you can. If it is almost time for your next dose, take only that dose. Do not take double or extra doses. What may interact with this medicine? Do not take this medicine with the following medications:  dasabuvir; ombitasvir; paritaprevir; ritonavir  defibrotide  selexipag This medicine may also interact with the following medications:  certain medicines that treat or prevent blood clots like warfarin  narcotic medicines for pain  NSAIDs,  medicines for pain and inflammation, like ibuprofen or naproxen  repaglinide  SNRIs, medicines for depression, like desvenlafaxine, duloxetine, levomilnacipran, venlafaxine  SSRIs, medicines for depression, like citalopram, escitalopram, fluoxetine, fluvoxamine, paroxetine, sertraline  stomach acid blockers like cimetidine, esomeprazole, omeprazole This list may not describe all possible interactions. Give your health care provider a list of all the medicines, herbs, non-prescription drugs, or dietary supplements you use. Also tell them if you smoke, drink alcohol, or use illegal drugs. Some items may interact with your medicine. What should I watch for while using this medicine? Visit your doctor or health care professional for regular check-ups. Do not stop taking your medicine unless your doctor tells you to. Notify your doctor or health care professional and seek emergency treatment if you develop breathing problems; changes in vision; chest pain; severe, sudden headache; pain, swelling, warmth in the leg; trouble speaking; sudden numbness or weakness of the face, arm or leg. These can be signs that your condition has gotten worse. If you are going to have surgery or dental work, tell your doctor or health care professional that you are taking this medicine. Certain genetic factors may reduce the effect of this medicine. Your doctor may  use genetic tests to determine treatment. Only take aspirin if you are instructed to. Low doses of aspirin are used with this medicine to treat some conditions. Taking aspirin with this medicine can increase your risk of bleeding so you must be careful. Talk to your doctor or pharmacist if you have questions. What side effects may I notice from receiving this medicine? Side effects that you should report to your doctor or health care professional as soon as possible:  allergic reactions like skin rash, itching or hives, swelling of the face, lips, or  tongue  signs and symptoms of bleeding such as bloody or black, tarry stools; red or dark-brown urine; spitting up blood or brown material that looks like coffee grounds; red spots on the skin; unusual bruising or bleeding from the eye, gums, or nose  signs and symptoms of a blood clot such as breathing problems; changes in vision; chest pain; severe, sudden headache; pain, swelling, warmth in the leg; trouble speaking; sudden numbness or weakness of the face, arm or leg  signs and symptoms of low blood sugar such as feeling anxious; confusion; dizziness; increased hunger; unusually weak or tired; increased sweating; shakiness; cold, clammy skin; irritable; headache; blurred vision; fast heartbeat; loss of consciousness Side effects that usually do not require medical attention (report to your doctor or health care professional if they continue or are bothersome):  constipation  diarrhea  headache  upset stomach This list may not describe all possible side effects. Call your doctor for medical advice about side effects. You may report side effects to FDA at 1-800-FDA-1088. Where should I keep my medicine? Keep out of the reach of children. Store at room temperature of 59 to 86 degrees F (15 to 30 degrees C). Throw away any unused medicine after the expiration date. NOTE: This sheet is a summary. It may not cover all possible information. If you have questions about this medicine, talk to your doctor, pharmacist, or health care provider.  2020 Elsevier/Gold Standard (2018-04-23 15:03:38)  Spironolactone tablets What is this medicine? SPIRONOLACTONE (speer on oh LAK tone) is a diuretic. It helps you make more urine and to lose excess water from your body. This medicine is used to treat high blood pressure, and edema or swelling from heart, kidney, or liver disease. It is also used to treat patients who make too much aldosterone or have low potassium. This medicine may be used for other  purposes; ask your health care provider or pharmacist if you have questions. COMMON BRAND NAME(S): Aldactone What should I tell my health care provider before I take this medicine? They need to know if you have any of these conditions:  high blood level of potassium  kidney disease or trouble making urine  liver disease  an unusual or allergic reaction to spironolactone, other medicines, foods, dyes, or preservatives  pregnant or trying to get pregnant  breast-feeding How should I use this medicine? Take this medicine by mouth with a drink of water. Follow the directions on your prescription label. You can take it with or without food. If it upsets your stomach, take it with food. Do not take your medicine more often than directed. Remember that you will need to pass more urine after taking this medicine. Do not take your doses at a time of day that will cause you problems. Do not take at bedtime. Talk to your pediatrician regarding the use of this medicine in children. While this drug may be prescribed for selected conditions, precautions  do apply. Overdosage: If you think you have taken too much of this medicine contact a poison control center or emergency room at once. NOTE: This medicine is only for you. Do not share this medicine with others. What if I miss a dose? If you miss a dose, take it as soon as you can. If it is almost time for your next dose, take only that dose. Do not take double or extra doses. What may interact with this medicine? Do not take this medicine with any of the following medications:  cidofovir  eplerenone  tranylcypromine This medicine may also interact with the following medications:  aspirin  certain medicines for blood pressure or heart disease like benazepril, lisinopril, losartan, valsartan  certain medicines that treat or prevent blood clots like heparin and enoxaparin  cholestyramine  cyclosporine  digoxin  lithium  medicines that  relax muscles for surgery  NSAIDs, medicines for pain and inflammation, like ibuprofen or naproxen  other diuretics  potassium supplements  steroid medicines like prednisone or cortisone  trimethoprim This list may not describe all possible interactions. Give your health care provider a list of all the medicines, herbs, non-prescription drugs, or dietary supplements you use. Also tell them if you smoke, drink alcohol, or use illegal drugs. Some items may interact with your medicine. What should I watch for while using this medicine? Visit your doctor or health care professional for regular checks on your progress. Check your blood pressure as directed. Ask your doctor what your blood pressure should be, and when you should contact them. You may need to be on a special diet while taking this medicine. Ask your doctor. Also, ask how many glasses of fluid you need to drink a day. You must not get dehydrated. This medicine may make you feel confused, dizzy or lightheaded. Drinking alcohol and taking some medicines can make this worse. Do not drive, use machinery, or do anything that needs mental alertness until you know how this medicine affects you. Do not sit or stand up quickly. What side effects may I notice from receiving this medicine? Side effects that you should report to your doctor or health care professional as soon as possible:  allergic reactions such as skin rash or itching, hives, swelling of the lips, mouth, tongue, or throat  black or tarry stools  fast, irregular heartbeat  fever  muscle pain, cramps  numbness, tingling in hands or feet  trouble breathing  trouble passing urine  unusual bleeding  unusually weak or tired Side effects that usually do not require medical attention (report to your doctor or health care professional if they continue or are bothersome):  change in voice or hair growth  confusion  dizzy, drowsy  dry mouth, increased  thirst  enlarged or tender breasts  headache  irregular menstrual periods  sexual difficulty, unable to have an erection  stomach upset This list may not describe all possible side effects. Call your doctor for medical advice about side effects. You may report side effects to FDA at 1-800-FDA-1088. Where should I keep my medicine? Keep out of the reach of children. Store below 25 degrees C (77 degrees F). Throw away any unused medicine after the expiration date. NOTE: This sheet is a summary. It may not cover all possible information. If you have questions about this medicine, talk to your doctor, pharmacist, or health care provider.  2020 Elsevier/Gold Standard (2017-04-07 09:42:28)

## 2019-11-08 ENCOUNTER — Encounter: Payer: Self-pay | Admitting: *Deleted

## 2019-11-08 ENCOUNTER — Telehealth: Payer: Self-pay | Admitting: Cardiology

## 2019-11-08 NOTE — Telephone Encounter (Signed)
Called patient and advised that he can take ranexa and imdur together per Dr. Bettina Gavia. Patient reports that the medication and shampoo he is using for his dog's fungal infection is purchased through the vet office and is not available over the counter. Advised patient Dr. Bettina Gavia is unsure if there is any potential interaction or effect between these products and patient's medications. Patient states that he will start wearing gloves when applying his dog's medications and bathing him. Patient is agreeable to plan and verbalized understanding. No further questions.

## 2019-11-08 NOTE — Telephone Encounter (Signed)
Pt c/o medication issue:  1. Name of Medication:   ranolazine (RANEXA) 500 MG 12 hr tablet  isosorbide mononitrate (IMDUR) 30 MG 24 hr tablet  2. How are you currently taking this medication (dosage and times per day)? 1 tablet by mouth once daily   3. Are you having a reaction (difficulty breathing--STAT)? No   4. What is your medication issue? Patient is calling wanting to make sure it is okay to take both medications at the same time. He has not yet started taking isosorbide mononitrate (IMDUR) 30 MG 24 hr tablet. He also would like to know if malaseb & phypochx+ket fungal shampoo he uses to wash his dog with every 3 days will cause a reaction or effect his medication.

## 2019-12-23 NOTE — Progress Notes (Signed)
Cardiology Office Note:    Date:  12/24/2019   ID:  Larry Warner, DOB 1956-10-22, MRN [id number]  PCP:  [name], MD  Cardiologist:  [name], MD    Referring MD: [name], MD    ASSESSMENT:    1. Ischemic cardiomyopathy   2. Coronary artery disease involving native coronary artery of native heart without angina pectoris   3. Essential hypertension   4. Mixed hyperlipidemia    PLAN:    In order of problems listed above:  1. Stable on guideline directed therapy but no RAS blockade with ARB rash recheck ejection fraction the first week of April remains less than 40 refer to the advanced heart failure clinic perhaps very challenging in a controlled environment would be appropriate continue current treatment including distal diuretic beta-blocker 2. Stable CAD no anginal discomfort continue beta-blocker oral nitrate closing dual antiplatelet therapy and his current statin 3. Stable hypertension BP at target on current treatment 4. Lipids are ideal continue his current high intensity statin 5. Well-controlled diabetes on appropriate cardiovascular diabetic protection.   Next appointment: Cardiogram performed   Medication Adjustments/Labs and Tests Ordered: Current medicines are reviewed at length with the patient today.  Concerns regarding medicines are outlined above.  No orders of the defined types were placed in this encounter.  No orders of the defined types were placed in this encounter.   Chief Complaint  Patient presents with  . Follow-up  Protocol: I was called to clinical diet which may administrator Estill Bamberg tolerated regular hospital calls good work I have not made you feel sick with medicines some heartburn prescription for omeprazole but take it daily for a week or 2.  Bariatric eval for radiation no trips to the emergency and liver other povidone daily you are eligible for the next lab take this slowly on a contract problem against with an acute weeks  obese 63 1 shock and all open up  History of Present Illness:    Larry Warner is a 64 y.o. male with a hx of  CAD (s/p anterior STEMI with PCI/DES to LAD and LCx 12/2017), ICM (EF 45-50% 05/2019), HTN, HLD, DM2. He presented to [facility] ED 09/10/19 after stressful situation at work precipitated chest pain similar to his anginal equivalent. he took 1 Nitroglycerin at work and the chest pain resolved, but his supervisors encouraged him to go to the ED. Troponin negative, EKG non-ischemic, stress test with moderate to large areas of infarction in the anterior, apical, and inferior walls concerning for probably peri-infarct ischemia in the distal inferolateral wall and EF 34%. He is ARB intolerant with rash.His ejection fraction is reduced 34% gated pool 35-45 by angiography and 45 by echocardiogram    He was transferred to [facility] for cardiac catheterization. Cath 09/13/19 with patent stent in prox LAD and mid LCx, 100% prox L PDA with collaterals and small caliber vessel. Recommended for medical management. Unclear if PDA occlusion is old or recent, but too small for PCI and collateralized.   He was last seen 02/2019. Compliance with diet, lifestyle and medications: Yes  I reviewed the monitor with the patient he has no complex ventricular arrhythmia.  He tolerates his guideline directed therapy but is not on ACE or ARB because of rash in the past we will plan the first week of April 6 months to reassess ejection fraction remains less than 40% reconsider the issue.  No angina dyspnea palpitations syncope pleased with the quality of his life and  has occasional heartburn relieved with PPI  Study Highlights A ZIO monitor was performed for 5 days and 5 hours beginning 11/07/2019 to assess for arrhythmia associated with CAD and reduced ejection fraction 34% by gated pool 35 to 40% by echocardiogram. The cardiac rhythm throughout was sinus with minimum average and maximum heart rates of 52, 77 and 119  bpm.  There were no episodes of atrial fibrillation or flutter. Thee were no pauses of 3 seconds or greater. Ventricular ectopy was rare with isolated PVCs. Supraventricular ectopy was rare with isolated APCs. There were no diary events. There were 3 triggered events 1 associated with a single atrial premature contraction.  Conclusion: Unremarkable ZIO monitor with no evidence of complex arrhythmia in the setting of CAD and reduced ejection fraction    Past Medical History:  Diagnosis Date  . Family history of colonic polyps   . Family history of FAP (familial adenomatous polyposis)   . GERD (gastroesophageal reflux disease)   . Hyperlipidemia     Past Surgical History:  Procedure Laterality Date  . 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 ESOPHAGOGASTRODUODENOSCOPY  01/04/2017   Mild gastrtis. Otherwise normal EGD with convetional scope and ERCP scope. No periampullary tumors.   . Laprascopic assisted subtotal colectomy    . LEFT HEART CATH AND CORONARY ANGIOGRAPHY N/A 09/13/2019   Procedure: LEFT HEART CATH AND CORONARY ANGIOGRAPHY;  Surgeon: Martinique, Peter M, MD;  Location: [place] [facility];  Service: Cardiovascular;  Laterality: N/A;  . WRIST SURGERY      Current Medications: Current Meds  Medication Sig  . ALPRAZolam (XANAX) 0.25 MG tablet Take 0.25 mg by mouth every 6 (six) hours as needed.  Marland Kitchen aspirin 81 MG chewable tablet Chew by mouth daily.  Marland Kitchen atorvastatin (LIPITOR) 80 MG tablet Take 80 mg by mouth daily.  . carvedilol (COREG) 6.25 MG tablet Take 6.25 mg by mouth 2 (two) times daily with a meal.  . celecoxib (CELEBREX) 200 MG capsule Take 200 mg by mouth 2 (two) times daily.  . cetirizine (ZYRTEC) 10 MG tablet Take 10 mg by mouth daily.  . clopidogrel (PLAVIX) 75 MG tablet Take 1 tablet (75 mg total) by mouth daily.  Marland Kitchen glipiZIDE (GLUCOTROL) 5 MG tablet Take by mouth daily before breakfast.  . isosorbide mononitrate  (IMDUR) 30 MG 24 hr tablet Take 1 tablet (30 mg total) by mouth daily.  . metFORMIN (GLUCOPHAGE) 850 MG tablet Take 850 mg by mouth every morning.   . nitroGLYCERIN (NITROSTAT) 0.4 MG SL tablet Place 1 tablet (0.4 mg total) under the tongue every 5 (five) minutes as needed for chest pain.  Marland Kitchen omeprazole (PRILOSEC) 20 MG capsule Take 20 mg by mouth daily.  . ranolazine (RANEXA) 500 MG 12 hr tablet Take 1 tablet (500 mg total) by mouth 2 (two) times daily.  Marland Kitchen spironolactone (ALDACTONE) 25 MG tablet Take 1 tablet (25 mg total) by mouth daily.  Marland Kitchen tolterodine (DETROL LA) 4 MG 24 hr capsule Take 4 mg by mouth daily.     Allergies:   Losartan   Social History   Socioeconomic History  . Marital status: Married    Spouse name: Not on file  . Number of children: Not on file  . Years of education: Not on file  . Highest education level: Not on file  Occupational History  . Not on file  Tobacco Use  . Smoking status: Former Smoker    Types: Cigarettes  Quit date: 12/05/2017    Years since quitting: 2.0  . Smokeless tobacco: Never Used  Substance and Sexual Activity  . Alcohol use: Yes    Comment: occ  . Drug use: Never  . Sexual activity: Not on file  Other Topics Concern  . Not on file  Social History Narrative  . Not on file   Social Determinants of Health   Financial Resource Strain:   . Difficulty of Paying Living Expenses: Not on file  Food Insecurity:   . Worried About Charity fundraiser in the Last Year: Not on file  . Ran Out of Food in the Last Year: Not on file  Transportation Needs:   . Lack of Transportation (Medical): Not on file  . Lack of Transportation (Non-Medical): Not on file  Physical Activity:   . Days of Exercise per Week: Not on file  . Minutes of Exercise per Session: Not on file  Stress:   . Feeling of Stress : Not on file  Social Connections:   . Frequency of Communication with Friends and Family: Not on file  . Frequency of Social Gatherings with  Friends and Family: Not on file  . Attends Religious Services: Not on file  . Active Member of Clubs or Organizations: Not on file  . Attends Archivist Meetings: Not on file  . Marital Status: Not on file     Family History: The patient's family history includes Colon polyps in his brother, mother, and sister; Colon polyps (age of onset: 19) in his son; Familial polyposis in his son. There is no history of Colon cancer. ROS:   Please see the history of present illness.    All other systems reviewed and are negative.  EKGs/Labs/Other Studies Reviewed:    The following studies were reviewed today:  Recent Labs: 12/17/1999 A1c 6.8% cholesterol 93 LDL 53 HDL 33  Physical Exam:    VS:  BP 112/80 (BP Location: Right Arm, Patient Position: Sitting, Cuff Size: Normal)   Pulse 91   Ht 5\' 8"  (1.727 m)   Wt 207 lb (93.9 kg)   SpO2 95%   BMI 31.47 kg/m     Wt Readings from Last 3 Encounters:  12/24/19 207 lb (93.9 kg)  11/07/19 205 lb (93 kg)  09/23/19 216 lb (98 kg)     GEN:  Well nourished, well developed in no acute distress HEENT: Normal NECK: No JVD; No carotid bruits LYMPHATICS: No lymphadenopathy CARDIAC: RRR, no murmurs, rubs, gallops RESPIRATORY:  Clear to auscultation without rales, wheezing or rhonchi  ABDOMEN: Soft, non-tender, non-distended MUSCULOSKELETAL:  No edema; No deformity  SKIN: Warm and dry NEUROLOGIC:  Alert and oriented x 3 PSYCHIATRIC:  Normal affect    Signed, [name], MD  12/24/2019 2:20 PM    [place]

## 2019-12-24 ENCOUNTER — Encounter: Payer: Self-pay | Admitting: Cardiology

## 2019-12-24 ENCOUNTER — Ambulatory Visit (INDEPENDENT_AMBULATORY_CARE_PROVIDER_SITE_OTHER): Payer: Managed Care, Other (non HMO) | Admitting: Cardiology

## 2019-12-24 ENCOUNTER — Other Ambulatory Visit: Payer: Self-pay

## 2019-12-24 VITALS — BP 112/80 | HR 91 | Ht 68.0 in | Wt 207.0 lb

## 2019-12-24 DIAGNOSIS — I255 Ischemic cardiomyopathy: Secondary | ICD-10-CM

## 2019-12-24 DIAGNOSIS — I10 Essential (primary) hypertension: Secondary | ICD-10-CM | POA: Diagnosis not present

## 2019-12-24 DIAGNOSIS — E782 Mixed hyperlipidemia: Secondary | ICD-10-CM

## 2019-12-24 DIAGNOSIS — I2510 Atherosclerotic heart disease of native coronary artery without angina pectoris: Secondary | ICD-10-CM | POA: Diagnosis not present

## 2019-12-24 NOTE — Patient Instructions (Addendum)
Medication Instructions:   Your physician recommends that you continue on your current medications as directed. Please refer to the Current Medication list given to you today.  *If you need a refill on your cardiac medications before your next appointment, please call your pharmacy*  Lab Work: None ordered   If you have labs (blood work) drawn today and your tests are completely normal, you will receive your results only by: Marland Kitchen MyChart Message (if you have MyChart) OR . A paper copy in the mail If you have any lab test that is abnormal or we need to change your treatment, we will call you to review the results.  Testing/Procedures: Your physician has requested that you have an echocardiogram in April. Echocardiography is a painless test that uses sound waves to create images of your heart. It provides your doctor with information about the size and shape of your heart and how well your heart's chambers and valves are working. This procedure takes approximately one hour. There are no restrictions for this procedure.    Follow-Up: Follow up with Dr. Bettina Gavia after Echo   Other Instructions None

## 2020-01-06 ENCOUNTER — Other Ambulatory Visit: Payer: Self-pay

## 2020-01-06 ENCOUNTER — Emergency Department (HOSPITAL_COMMUNITY): Payer: Managed Care, Other (non HMO)

## 2020-01-06 ENCOUNTER — Emergency Department (HOSPITAL_COMMUNITY)
Admission: EM | Admit: 2020-01-06 | Discharge: 2020-01-07 | Disposition: A | Discharge status: Home / Self Care | Payer: Managed Care, Other (non HMO) | Attending: Emergency Medicine | Admitting: Emergency Medicine

## 2020-01-06 ENCOUNTER — Encounter (HOSPITAL_COMMUNITY): Payer: Self-pay

## 2020-01-06 DIAGNOSIS — Z7982 Long term (current) use of aspirin: Secondary | ICD-10-CM | POA: Diagnosis not present

## 2020-01-06 DIAGNOSIS — I25119 Atherosclerotic heart disease of native coronary artery with unspecified angina pectoris: Secondary | ICD-10-CM | POA: Diagnosis not present

## 2020-01-06 DIAGNOSIS — Z87891 Personal history of nicotine dependence: Secondary | ICD-10-CM | POA: Diagnosis not present

## 2020-01-06 DIAGNOSIS — R079 Chest pain, unspecified: Secondary | ICD-10-CM | POA: Diagnosis present

## 2020-01-06 DIAGNOSIS — Z79899 Other long term (current) drug therapy: Secondary | ICD-10-CM | POA: Insufficient documentation

## 2020-01-06 DIAGNOSIS — Z7984 Long term (current) use of oral hypoglycemic drugs: Secondary | ICD-10-CM | POA: Diagnosis not present

## 2020-01-06 DIAGNOSIS — E119 Type 2 diabetes mellitus without complications: Secondary | ICD-10-CM | POA: Diagnosis not present

## 2020-01-06 DIAGNOSIS — I209 Angina pectoris, unspecified: Secondary | ICD-10-CM

## 2020-01-06 LAB — CBC
HCT: 42.4 % (ref 39.0–52.0)
Hemoglobin: 14.5 g/dL (ref 13.0–17.0)
MCH: 31.5 pg (ref 26.0–34.0)
MCHC: 34.2 g/dL (ref 30.0–36.0)
MCV: 92 fL (ref 80.0–100.0)
Platelets: 289 10*3/uL (ref 150–400)
RBC: 4.61 MIL/uL (ref 4.22–5.81)
RDW: 13.3 % (ref 11.5–15.5)
WBC: 12.1 10*3/uL — ABNORMAL HIGH (ref 4.0–10.5)
nRBC: 0 % (ref 0.0–0.2)

## 2020-01-06 LAB — BASIC METABOLIC PANEL
Anion gap: 10 (ref 5–15)
BUN: 15 mg/dL (ref 8–23)
CO2: 21 mmol/L — ABNORMAL LOW (ref 22–32)
Calcium: 9.9 mg/dL (ref 8.9–10.3)
Chloride: 108 mmol/L (ref 98–111)
Creatinine, Ser: 1 mg/dL (ref 0.61–1.24)
GFR calc Af Amer: >60 mL/min (ref >60)
GFR calc non Af Amer: >60 mL/min (ref >60)
Glucose, Bld: 144 mg/dL — ABNORMAL HIGH (ref 70–99)
Potassium: 4.3 mmol/L (ref 3.5–5.1)
Sodium: 139 mmol/L (ref 135–145)

## 2020-01-06 LAB — TROPONIN I (HIGH SENSITIVITY): Troponin I (High Sensitivity): 16 ng/L (ref <18)

## 2020-01-06 NOTE — ED Triage Notes (Signed)
Pt from home with chest pain at 3am this morning, sent from [facility] for abnormal EKG. Recent MI in October with stents placed. Pt a.o, no other symptoms.   324 ASA 1 Nitro given  Pt now pain free. 18RAC

## 2020-01-07 DIAGNOSIS — I25119 Atherosclerotic heart disease of native coronary artery with unspecified angina pectoris: Secondary | ICD-10-CM | POA: Diagnosis not present

## 2020-01-07 LAB — TROPONIN I (HIGH SENSITIVITY): Troponin I (High Sensitivity): 17 ng/L (ref <18)

## 2020-01-07 MED ORDER — ISOSORBIDE MONONITRATE ER 60 MG PO TB24: 60.0000 mg | ORAL_TABLET | Freq: Every day | ORAL | 0 refills | Status: DC

## 2020-01-07 NOTE — Discharge Instructions (Signed)
Increase your home dose of Imdur from 30mg  to 60 mg.  Since you still have part of a prescription at home, take 2 of your 30 mg tablets until you finish the bottle at home. The new prescription I have given you is for the 60 mg tablets. If you fill this prescription, you should only take one of these.  If you develop an episode of chest pain at home, you should use one of your home nitroglycerin and let them dissolve under the tongue.  Call Dr. Bettina Gavia to schedule a follow-up appointment to be seen within the next week for evaluation.  Return to the emergency department if you develop chest pain that does not resolve with your home nitroglycerin, chest pain with respiratory distress, if you pass out, or other new, concerning symptoms.

## 2020-01-07 NOTE — Progress Notes (Signed)
Cardiology Office Note:    Date:  01/08/2020   ID:  Larry Warner, DOB February 22, 1956, MRN [id number]  PCP:  [name], MD  Cardiologist:  [name], MD    Referring MD: [name], MD    ASSESSMENT:    1. Coronary artery disease involving native coronary artery of native heart without angina pectoris   2. Essential hypertension   3. Mixed hyperlipidemia   4. Ischemic cardiomyopathy    PLAN:    In order of problems listed above:  1. An episode of angina precipitated by vigorous physical activity emotional distress and failure to take nitroglycerin.  Will optimize medical treatment the higher dose oral nitrates intensify ranolazine and strongly encouraged him that when he has episodes of stop and rest and take nitroglycerin and try not to push through.  He voices understanding of continue his other medical treatment dual antiplatelet aspirin clopidogrel and blocker.  I do not think he should have repeat coronary angiography performed 4 months ago. 2. Stable continue current treatment including MRA 3. Stable continue statin 4. Follow-up echocardiogram in 2 weeks regarding ejection fraction if remains severely reduced would need to consider the merits of prophylactic ICD.   Next appointment: 6 weeks   Medication Adjustments/Labs and Tests Ordered: Current medicines are reviewed at length with the patient today.  Concerns regarding medicines are outlined above.  No orders of the defined types were placed in this encounter.  No orders of the defined types were placed in this encounter.   Chief Complaint  Patient presents with  . Follow-up  . Coronary Artery Disease    History of Present Illness:    Larry Warner is a 64 y.o. male with a hx of CAD (s/p anterior STEMI with PCI/DES to LAD and LCx 12/2017), ICM (EF 45-50% 05/2019), HTN, HLD, DM2. He presented to [facility] ED 09/10/19 after stressful situation at work precipitated chest pain similar to his anginal equivalent. he  took 1 Nitroglycerin at work and the chest pain resolved, but his supervisors encouraged him to go to the ED. Troponin negative, EKG non-ischemic, stress test with moderate to large areas of infarction in the anterior, apical, and inferior walls concerning for probably peri-infarct ischemia in the distal inferolateral wall and EF 34%. He is ARB intolerant with rash.His ejection fraction is reduced 34% gated pool 35-45 by angiography and 45 by echocardiogram He was transferred to [facility] for cardiac catheterization. Cath 09/13/19 with patent stent in prox LAD and mid LCx, 100% prox L PDA with collaterals and small caliber vessel. Recommended for medical management.  He was last seen 12/24/2019.  Compliance with diet, lifestyle and medications: Yes  I reviewed his ED records with him.  He was under intense stress at work was doing the job of several individuals and kept having vague chest discomfort and pushing himself.  Unfortunately did not have nitroglycerin.  He was very apprehensive and he feels better now and has had no recurrent chest pain.  I reviewed his coronary angiogram with him we will maximize medical therapy by going to the next dose of Ranexa and continue his current antianginal treatment.  We had a nice discussion and he understands the necessity when he has angina to stop and rest and take nitroglycerin and not to try to push through it physically and emotionally.  He is having no edema shortness of breath palpitation or syncope.  He was seen in the emergency room [facility] 01/07/2020 with prolonged chest pain.  He was given nitroglycerin by EMT with relief and his  EKG showed sinus rhythm incomplete right bundle branch block initial and subsequent high-sensitivity troponin within normal range.  There was a consultation with cardiology and recommended increasing oral nitrates and discharge for outpatient follow-up.   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  Left heart catheterization 09/13/2019 1. CAD. Left dominant circulation    - Widely patent stent in the proximal LAD    - Widely patent stent in the mid LCx    - a tiny, wispy intermediate branch is occluded    - 100% proximal left PDA. There are collaterals to this vessel. The vessel is small in caliber 2. Moderate LV dysfunction. EF estimated at 40%.  3. Normal LVEDP  Coronary Diagrams  Diagnostic Dominance: Left     Past Medical History:  Diagnosis Date  . Family history of colonic polyps   . Family history of FAP (familial adenomatous polyposis)   . GERD (gastroesophageal reflux disease)   . Hyperlipidemia     Past Surgical History:  Procedure Laterality Date  . 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 ESOPHAGOGASTRODUODENOSCOPY  01/04/2017   Mild gastrtis. Otherwise normal EGD with convetional scope and ERCP scope. No periampullary tumors.   . Laprascopic assisted subtotal colectomy    . LEFT HEART CATH AND CORONARY ANGIOGRAPHY N/A 09/13/2019   Procedure: LEFT HEART CATH AND CORONARY ANGIOGRAPHY;  Surgeon: Martinique, Peter M, MD;  Location: [place] [facility];  Service: Cardiovascular;  Laterality: N/A;  . WRIST SURGERY      Current Medications: Current Meds  Medication Sig  . ALPRAZolam (XANAX) 0.25 MG tablet Take 0.25 mg by mouth every 6 (six) hours as needed.  Marland Kitchen aspirin 81 MG chewable tablet Chew by mouth daily.  Marland Kitchen atorvastatin (LIPITOR) 80 MG tablet Take 80 mg by mouth daily.  . carvedilol (COREG) 6.25 MG tablet Take 6.25 mg by mouth 2 (two) times daily with a meal.  . celecoxib (CELEBREX) 200 MG capsule Take 200 mg by mouth 2 (two) times daily.  . cetirizine (ZYRTEC) 10 MG tablet Take 10 mg by mouth daily.  . clopidogrel (PLAVIX) 75 MG tablet Take 1 tablet (75 mg total) by  mouth daily.  Marland Kitchen glipiZIDE (GLUCOTROL) 5 MG tablet Take by mouth daily before breakfast.  . isosorbide mononitrate (IMDUR) 60 MG 24 hr tablet Take 1 tablet (60 mg total) by mouth daily.  . metFORMIN (GLUCOPHAGE) 850 MG tablet Take 850 mg by mouth every morning.   . nitroGLYCERIN (NITROSTAT) 0.4 MG SL tablet Place 1 tablet (0.4 mg total) under the tongue every 5 (five) minutes as needed for chest pain.  Marland Kitchen omeprazole (PRILOSEC) 20 MG capsule Take 20 mg by mouth daily.  . ranolazine (RANEXA) 500 MG 12 hr tablet Take 1 tablet (500 mg total) by mouth 2 (two) times daily.  Marland Kitchen spironolactone (ALDACTONE) 25 MG tablet Take 1 tablet (25 mg total) by mouth daily.  Marland Kitchen tolterodine (DETROL LA) 4 MG 24 hr capsule Take 4 mg by mouth daily.     Allergies:   Losartan   Social History   Socioeconomic History  . Marital status: Married    Spouse name: Not on file  . Number of children: Not on file  .  Years of education: Not on file  . Highest education level: Not on file  Occupational History  . Not on file  Tobacco Use  . Smoking status: Former Smoker    Types: Cigarettes    Quit date: 12/05/2017    Years since quitting: 2.0  . Smokeless tobacco: Never Used  Substance and Sexual Activity  . Alcohol use: Yes    Comment: occ  . Drug use: Never  . Sexual activity: Not on file  Other Topics Concern  . Not on file  Social History Narrative  . Not on file   Social Determinants of Health   Financial Resource Strain:   . Difficulty of Paying Living Expenses: Not on file  Food Insecurity:   . Worried About Charity fundraiser in the Last Year: Not on file  . Ran Out of Food in the Last Year: Not on file  Transportation Needs:   . Lack of Transportation (Medical): Not on file  . Lack of Transportation (Non-Medical): Not on file  Physical Activity:   . Days of Exercise per Week: Not on file  . Minutes of Exercise per Session: Not on file  Stress:   . Feeling of Stress : Not on file  Social  Connections:   . Frequency of Communication with Friends and Family: Not on file  . Frequency of Social Gatherings with Friends and Family: Not on file  . Attends Religious Services: Not on file  . Active Member of Clubs or Organizations: Not on file  . Attends Archivist Meetings: Not on file  . Marital Status: Not on file     Family History: The patient's family history includes Colon polyps in his brother, mother, and sister; Colon polyps (age of onset: 32) in his son; Familial polyposis in his son. There is no history of Colon cancer. ROS:   Please see the history of present illness.    All other systems reviewed and are negative.  EKGs/Labs/Other Studies Reviewed:    The following studies were reviewed today:   EKG yesterday ED independently reviewed sinus rhythm right bundle branch block old anterior septal MI minor residual ST elevation seen on previous EKG similar pattern  Recent Labs: 01/06/2020: BUN 15; Creatinine, Ser 1.00; Hemoglobin 14.5; Platelets 289; Potassium 4.3; Sodium 139  Recent Lipid Panel No results found for: CHOL, TRIG, HDL, CHOLHDL, VLDL, LDLCALC, LDLDIRECT  Physical Exam:    VS:  BP 118/78   Pulse (!) 43   Ht 5\' 8"  (1.727 m)   Wt 209 lb (94.8 kg)   SpO2 99%   BMI 31.78 kg/m     Wt Readings from Last 3 Encounters:  01/08/20 209 lb (94.8 kg)  12/24/19 207 lb (93.9 kg)  11/07/19 205 lb (93 kg)     GEN: Anxious well nourished, well developed in no acute distress HEENT: Normal NECK: No JVD; No carotid bruits LYMPHATICS: No lymphadenopathy CARDIAC: RRR, no murmurs, rubs, gallops RESPIRATORY:  Clear to auscultation without rales, wheezing or rhonchi  ABDOMEN: Soft, non-tender, non-distended MUSCULOSKELETAL:  No edema; No deformity  SKIN: Warm and dry NEUROLOGIC:  Alert and oriented x 3 PSYCHIATRIC:  Normal affect    Signed, [name], MD  01/08/2020 8:21 AM    [place]

## 2020-01-07 NOTE — ED Notes (Addendum)
Pt came to the ED per triage complaint. Pt conscious, breathing, and A&Ox4. Pt brought back to [address] via wheelchair. Pt endorses "I had chest pain that is mid-sternal and radiates to his neck". Chest rise and fall equally with non-labored breathing. Lungs clear apex to base. Abd soft and non-tender. Pt denies chest pain, n/v/d, shortness of breath, and f/c. PIVC placed on the RAC with a 20G which had positive blood return and flushed without pain or infiltration. Blood collected, labeled, and sent to lab. Bed in lowest position with call light within reach. Pt on continuous blood pressure, pulse ox, and cardiac monitor. Will continue to monitor. Awaiting MD eval. No distress noted.

## 2020-01-07 NOTE — ED Provider Notes (Signed)
[place] EMERGENCY DEPARTMENT Provider Note   CSN: [id number] Arrival date & time: 01/06/20  1722     History Chief Complaint  Patient presents with  . Chest Pain    Larry Warner is a 64 y.o. male with a history of CAD s/p PCI x2, GERD, diabetes mellitus type 2, HLD, HTN who presents to the emergency department with a chief complaint of chest pain.  The patient reports that he awoke for work at approximately 3 AM with burning pain in his chest radiating up into his throat.  Pain did not radiate to his neck, back, or arm.  He reports that the pain was similar to when he presented with unstable angina in October 2020.  He reports that he took multiple Rolaids at home with minimal improvement in his symptoms.  He reports that the pain came and went multiple times throughout the morning.  He went to urgent care and states that he had an EKG performed.  He states that the practitioner reviewed his old EKG and did not see any changes, but given that he was continuing to have chest pain he was sent by EMS to [facility] for further evaluation.  Reports that he was given nitroglycerin in route with EMS and his pain resolved, and he has not had further episodes of pain.  He denies associated fever, chills, shortness of breath, cough, burping or belching, diaphoresis, nausea, vomiting, diarrhea, abdominal pain, numbness, weakness, leg swelling, palpitations, or URI symptoms.  He reports that he has been pain-free until his symptoms began today.  He has a prescription of nitroglycerin at home, but has not had to use it.  He last followed up with Dr. Bettina Gavia in January 2020.  He has been compliant with all of his home medications.  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  The history is provided by the patient. No language interpreter was used.       Past Medical History:  Diagnosis  Date  . Family history of colonic polyps   . Family history of FAP (familial adenomatous polyposis)   . GERD (gastroesophageal reflux disease)   . Hyperlipidemia     Patient Active Problem List   Diagnosis Date Noted  . Unstable angina ([place]) 09/13/2019  . HTN (hypertension) 09/13/2019  . HLD (hyperlipidemia) 09/13/2019  . DM type 2 (diabetes mellitus, type 2) ([place]) 09/13/2019  . Abnormal nuclear cardiac imaging test   . Genetic testing 12/28/2018  . Familial adenomatous polyposis gene positive 12/28/2018  . History of colon polyps 12/17/2018  . Family history of colonic polyps   . Family history of FAP (familial adenomatous polyposis)   . Dizziness 03/14/2018  . CAD (coronary artery disease) 02/28/2018    Past Surgical History:  Procedure Laterality Date  . 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 ESOPHAGOGASTRODUODENOSCOPY  01/04/2017   Mild gastrtis. Otherwise normal EGD with convetional scope and ERCP scope. No periampullary tumors.   . Laprascopic assisted subtotal colectomy    . LEFT HEART CATH AND CORONARY ANGIOGRAPHY N/A 09/13/2019   Procedure: LEFT HEART CATH AND CORONARY ANGIOGRAPHY;  Surgeon: Martinique, Peter M, MD;  Location: [place] [facility];  Service: Cardiovascular;  Laterality: N/A;  . WRIST SURGERY         Family History  Problem Relation Age of Onset  .  Colon polyps Mother        <12 polyps  . Colon polyps Sister   . Colon polyps Brother        had colectomy due to polyp count  . Colon polyps Son 70       total colectomy  . Familial polyposis Son        APC mutation  . Colon cancer Neg Hx     Social History   Tobacco Use  . Smoking status: Former Smoker    Types: Cigarettes    Quit date: 12/05/2017    Years since quitting: 2.0  . Smokeless tobacco: Never Used  Substance Use Topics  . Alcohol use: Yes    Comment: occ  . Drug use: Never    Home Medications Prior to Admission medications     Medication Sig Start Date End Date Taking? Authorizing Provider  ALPRAZolam (XANAX) 0.25 MG tablet Take 0.25 mg by mouth every 6 (six) hours as needed. 12/17/19   [provider]  aspirin 81 MG chewable tablet Chew by mouth daily.    [provider]  atorvastatin (LIPITOR) 80 MG tablet Take 80 mg by mouth daily.    [provider]  carvedilol (COREG) 6.25 MG tablet Take 6.25 mg by mouth 2 (two) times daily with a meal.    [provider]  celecoxib (CELEBREX) 200 MG capsule Take 200 mg by mouth 2 (two) times daily.    [provider]  cetirizine (ZYRTEC) 10 MG tablet Take 10 mg by mouth daily.    [provider]  clopidogrel (PLAVIX) 75 MG tablet Take 1 tablet (75 mg total) by mouth daily. 11/07/19   [name], MD  glipiZIDE (GLUCOTROL) 5 MG tablet Take by mouth daily before breakfast.    [provider]  isosorbide mononitrate (IMDUR) 60 MG 24 hr tablet Take 1 tablet (60 mg total) by mouth daily. 01/07/20 02/06/20  Landin Tallon A, PA-C  metFORMIN (GLUCOPHAGE) 850 MG tablet Take 850 mg by mouth every morning.     [provider]  nitroGLYCERIN (NITROSTAT) 0.4 MG SL tablet Place 1 tablet (0.4 mg total) under the tongue every 5 (five) minutes as needed for chest pain. 09/13/19 09/12/20  Kroeger, Lorelee Cover., PA-C  omeprazole (PRILOSEC) 20 MG capsule Take 20 mg by mouth daily.    [provider]  ranolazine (RANEXA) 500 MG 12 hr tablet Take 1 tablet (500 mg total) by mouth 2 (two) times daily. 11/07/19   [name], MD  spironolactone (ALDACTONE) 25 MG tablet Take 1 tablet (25 mg total) by mouth daily. 11/07/19 02/05/20  [name], MD  tolterodine (DETROL LA) 4 MG 24 hr capsule Take 4 mg by mouth daily.    [provider]    Allergies    Losartan  Review of Systems   Review of Systems  Constitutional: Negative for appetite change, chills and fever.  HENT: Negative for congestion, sinus pressure, sinus  pain, sore throat and voice change.   Eyes: Negative for visual disturbance.  Respiratory: Negative for cough and shortness of breath.   Cardiovascular: Positive for chest pain. Negative for palpitations and leg swelling.  Gastrointestinal: Negative for abdominal pain, constipation, diarrhea, nausea and vomiting.  Genitourinary: Negative for dysuria.  Musculoskeletal: Negative for back pain, joint swelling, myalgias, neck pain and neck stiffness.  Skin: Negative for rash.  Allergic/Immunologic: Negative for immunocompromised state.  Neurological: Negative for dizziness, seizures, syncope, weakness and headaches.  Psychiatric/Behavioral: Negative  for confusion.    Physical Exam Updated Vital Signs BP 120/82 (BP Location: Right Arm)   Pulse 62   Temp 98 F (36.7 C) (Oral)   Resp 18   SpO2 98%   Physical Exam Vitals and nursing note reviewed.  Constitutional:      General: He is not in acute distress.    Appearance: He is well-developed. He is obese. He is not ill-appearing, toxic-appearing or diaphoretic.     Comments: Well-appearing.  No acute distress.  HENT:     Head: Normocephalic.  Eyes:     Conjunctiva/sclera: Conjunctivae normal.  Cardiovascular:     Rate and Rhythm: Normal rate and regular rhythm.     Pulses: Normal pulses.     Heart sounds: Normal heart sounds. No murmur. No friction rub. No gallop.      Comments: Peripheral pulses are 2+ and symmetric. Pulmonary:     Effort: Pulmonary effort is normal. No respiratory distress.     Breath sounds: No stridor. No wheezing, rhonchi or rales.     Comments: Lungs are clear to auscultation bilaterally.  No increased work of breathing.  No tenderness to palpation to the chest wall. Chest:     Chest wall: No tenderness.  Abdominal:     General: There is no distension.     Palpations: Abdomen is soft. There is no mass.     Tenderness: There is no abdominal tenderness. There is no right CVA tenderness, left CVA tenderness,  guarding or rebound.     Hernia: No hernia is present.     Comments: Abdomen is obese, but soft and nontender.  Musculoskeletal:     Cervical back: Neck supple.     Right lower leg: No edema.     Left lower leg: No edema.     Comments: No peripheral edema  Skin:    General: Skin is warm and dry.  Neurological:     Mental Status: He is alert.  Psychiatric:        Behavior: Behavior normal.     ED Results / Procedures / Treatments   Labs (all labs ordered are listed, but only abnormal results are displayed) Labs Reviewed  BASIC METABOLIC PANEL - Abnormal; Notable for the following components:      Result Value   CO2 21 (*)    Glucose, Bld 144 (*)    All other components within normal limits  CBC - Abnormal; Notable for the following components:   WBC 12.1 (*)    All other components within normal limits  TROPONIN I (HIGH SENSITIVITY)  TROPONIN I (HIGH SENSITIVITY)    EKG EKG Interpretation  Date/Time:  Monday January 06 2020 17:24:06 EST Ventricular Rate:  89 PR Interval:  150 QRS Duration: 104 QT Interval:  372 QTC Calculation: 452 R Axis:   44 Text Interpretation: Normal sinus rhythm Incomplete right bundle branch block Possible Anteroseptal infarct , age undetermined Abnormal ECG No old tracing to compare Confirmed by Pryor Curia [phone]) on 01/07/2020 2:12:50 AM   Radiology DG Chest 2 View  Result Date: 01/06/2020 CLINICAL DATA:  Chest pain since 3 a.m. EXAM: CHEST - 2 VIEW COMPARISON:  September 18, 2019 FINDINGS: The heart size and mediastinal contours are within normal limits. Both lungs are clear. The visualized skeletal structures are unremarkable. IMPRESSION: No active cardiopulmonary disease. Electronically Signed   By: Abelardo Diesel M.D.   On: 01/06/2020 18:05    Procedures Procedures (including critical care time)  Medications Ordered in  ED Medications - No data to display  ED Course  I have reviewed the triage vital signs and the nursing  notes.  Pertinent labs & imaging results that were available during my care of the patient were reviewed by me and considered in my medical decision making (see chart for details).    MDM Rules/Calculators/A&P                      64 year old male with a history of CAD s/p PCI x2, GERD, diabetes mellitus type 2, HLD, HTN presenting with chest pain that felt similar to the patient's previous unstable angina that began earlier today.  Pain resolved after nitroglycerin given by EMT.  Vital signs are normal.  The patient was discussed with Dr. Leonides Schanz, attending physician.  EKG with normal sinus rhythm and incomplete right bundle branch block.  No previous available for comparison. Delta troponin is flat.  Chest x-ray is unremarkable.  Labs are otherwise reassuring.  Since the patient has a history of CAD, unable to apply HEAR score.  Patient's heart catheterization was reviewed.  The patient was discussed with Dr. Leonides Schanz, attending physician.  Given the patient's history of CAD, cardiology was consulted.  Spoke with Dr. Paticia Stack.  He recommends increasing the patient's Imdur to 60 mg daily.  He also wants to assure that the patient has sublingual nitroglycerin at home, which the patient has confirmed that he does have a prescription at home that is not expired since the patient is pain-free at this time.  He also would like the patient to follow-up with Dr. Bettina Gavia with cardiology.  I have discussed this plan with the patient who is agreeable at this time.  All questions answered.  He is hemodynamically stable and in no acute distress.  Safe for discharge home with outpatient follow-up as indicated.  Final Clinical Impression(s) / ED Diagnoses Final diagnoses:  Angina pectoris ([place])    Rx / DC Orders ED Discharge Orders         Ordered    isosorbide mononitrate (IMDUR) 60 MG 24 hr tablet  Daily     01/07/20 0339           Joline Maxcy A, PA-C 01/07/20 0852    Ward, Delice Bison, DO 01/08/20 0130

## 2020-01-08 ENCOUNTER — Other Ambulatory Visit: Payer: Self-pay

## 2020-01-08 ENCOUNTER — Ambulatory Visit (INDEPENDENT_AMBULATORY_CARE_PROVIDER_SITE_OTHER): Payer: Managed Care, Other (non HMO) | Admitting: Cardiology

## 2020-01-08 ENCOUNTER — Encounter: Payer: Self-pay | Admitting: Cardiology

## 2020-01-08 VITALS — BP 118/78 | HR 43 | Ht 68.0 in | Wt 209.0 lb

## 2020-01-08 DIAGNOSIS — I2510 Atherosclerotic heart disease of native coronary artery without angina pectoris: Secondary | ICD-10-CM | POA: Diagnosis not present

## 2020-01-08 DIAGNOSIS — E782 Mixed hyperlipidemia: Secondary | ICD-10-CM

## 2020-01-08 DIAGNOSIS — I255 Ischemic cardiomyopathy: Secondary | ICD-10-CM

## 2020-01-08 DIAGNOSIS — I10 Essential (primary) hypertension: Secondary | ICD-10-CM

## 2020-01-08 MED ORDER — RANOLAZINE ER 1000 MG PO TB12: 1000.0000 mg | ORAL_TABLET | Freq: Two times a day (BID) | ORAL | 3 refills | Status: DC

## 2020-01-08 MED ORDER — NITROGLYCERIN 0.4 MG SL SUBL: 0.4000 mg | SUBLINGUAL_TABLET | SUBLINGUAL | 3 refills | Status: DC | PRN

## 2020-01-08 NOTE — Patient Instructions (Signed)
Medication Instructions:  Your physician has recommended you make the following change in your medication:  INCREASE Ranexa to 1000 mg (1 tablet) twice daily  *If you need a refill on your cardiac medications before your next appointment, please call your pharmacy*  Lab Work: NONE If you have labs (blood work) drawn today and your tests are completely normal, you will receive your results only by: Marland Kitchen MyChart Message (if you have MyChart) OR . A paper copy in the mail If you have any lab test that is abnormal or we need to change your treatment, we will call you to review the results.  Testing/Procedures: NONE  Follow-Up: At [facility], you and your health needs are our priority.  As part of our continuing mission to provide you with exceptional heart care, we have created designated Provider Care Teams.  These Care Teams include your primary Cardiologist (physician) and Advanced Practice Providers (APPs -  Physician Assistants and Nurse Practitioners) who all work together to provide you with the care you need, when you need it.  Your next appointment:   6 week(s)  The format for your next appointment:   In Person  Provider:   [name], MD

## 2020-02-24 ENCOUNTER — Other Ambulatory Visit: Payer: Self-pay | Admitting: Cardiology

## 2020-03-05 ENCOUNTER — Other Ambulatory Visit: Payer: Self-pay | Admitting: Cardiology

## 2020-03-05 NOTE — Telephone Encounter (Signed)
Rx refill sent to pharmacy. 

## 2020-03-18 ENCOUNTER — Ambulatory Visit: Payer: Managed Care, Other (non HMO) | Admitting: Cardiology

## 2020-03-18 ENCOUNTER — Other Ambulatory Visit: Payer: Managed Care, Other (non HMO)

## 2020-03-27 ENCOUNTER — Other Ambulatory Visit: Payer: Self-pay

## 2020-03-28 NOTE — Progress Notes (Signed)
Cardiology Office Note:    Date:  03/31/2020   ID:  Larry Warner, DOB 03/23/1956, MRN [id number]  PCP:  [name], MD  Cardiologist:  [name], MD    Referring MD: [name], MD    ASSESSMENT:    1. Coronary artery disease involving native coronary artery of native heart without angina pectoris   2. Essential hypertension   3. Mixed hyperlipidemia   4. Ischemic cardiomyopathy    PLAN:    In order of problems listed above:  1. Stable CAD having no angina on current medical therapy continue his current treatment with therapy of aspirin clopidogrel beta-blocker oral nitrates and ranolazine.  I need to see the EKG for QT interval. 2. BP at target continue current treatment he is not on ACE or ARB or Entresto with allergy 3. Lipids are ideal continue his current high intensity statin 4. Recheck echocardiogram around the time of his next visit.  At this time I do not think he requires ICD therapy   Next appointment: 6 months   Medication Adjustments/Labs and Tests Ordered: Current medicines are reviewed at length with the patient today.  Concerns regarding medicines are outlined above.  No orders of the defined types were placed in this encounter.  No orders of the defined types were placed in this encounter.   Chief Complaint  Patient presents with  . Follow-up  . Coronary Artery Disease  . Cardiomyopathy    History of Present Illness:    Larry Warner is a 64 y.o. male with a hx of  CAD (s/p anterior STEMI with PCI/DES to LAD and LCx 12/2017), ICM (EF 45-50% 05/2019), HTN, HLD, DM2  last seen 11/07/2019.  He presented to [facility] ED 09/10/2019 with prolonged chest pain and was transferred to [facility] for cardiac catheterization. Cath 09/13/19 with patent stent in prox LAD and mid LCx, 100% prox L PDA with collaterals and small caliber vessel. Recommended for medical management. Unclear if PDA occlusion is old or recent, but too small for PCI and collateralized.    Left ventricular ejection fraction estimated 40% at the time of coronary angiography 09/13/2019. [facility] ED 01/06/2020 for chest pain.  Repeat troponin was normal EKG showed sinus rhythm incomplete right bundle branch block no acute ischemic changes.  Oral nitrate was increased and advised to follow-up with outpatient cardiology. Compliance with diet, lifestyle and medications: Yes  Doing better has had no further angina shortness of breath chest pain palpitation or syncope.  He is not on ACE inhibitor or ARB or Entresto because of allergy.  Recent labs done at his primary care physician's office are reassuring cholesterol 183 HDL 84 LDL 79 normal creatinine.  He said he has no EKG done his primary care physician's office will bring a copy in.  Leaves for me to review   2 mo ago  (01/07/20) 2 mo ago  (01/06/20)   Troponin I (High Sensitivity) <18 ng/L 17  16 CM    EKG from 01/07/2020 personally reviewed I agree with the interpretation with the exception that also shows old anteroseptal MI.  EKG was unchanged compared to 11/07/2019. Past Medical History:  Diagnosis Date  . Abnormal nuclear cardiac imaging test   . CAD (coronary artery disease) 02/28/2018  . 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 Dizziness 03/14/2018  . Essential hypertension 01/01/2018  . Familial adenomatous polyposis gene positive 12/28/2018   APC [id number]  . Family history of colonic polyps   .  Family history of FAP (familial adenomatous polyposis)   . 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  The report date is 12/28/2018.  Marland Kitchen GERD (gastroesophageal reflux disease)   . History of colon polyps 12/17/2018  . Hyperglycemia 01/01/2018  . Hyperlipidemia   . Jaundice of recent onset 03/14/2018  . Left ventricular dysfunction 05/10/2019  . Nicotine abuse 01/01/2018  . Plantar fasciitis 07/13/2017  . ST elevation myocardial infarction  involving left anterior descending (LAD) coronary artery ([place]) 01/01/2018   Formatting of this note might be different from the original. Added automatically from request for surgery [phone]  . Type 2 diabetes mellitus with circulatory disorder ([place]) 01/30/2018  . Unstable angina ([place]) 09/13/2019    Past Surgical History:  Procedure Laterality Date  . 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 ESOPHAGOGASTRODUODENOSCOPY  01/04/2017   Mild gastrtis. Otherwise normal EGD with convetional scope and ERCP scope. No periampullary tumors.   . Laprascopic assisted subtotal colectomy    . LEFT HEART CATH AND CORONARY ANGIOGRAPHY N/A 09/13/2019   Procedure: LEFT HEART CATH AND CORONARY ANGIOGRAPHY;  Surgeon: Martinique, Peter M, MD;  Location: [place] [facility];  Service: Cardiovascular;  Laterality: N/A;  . WRIST SURGERY      Current Medications: Current Meds  Medication Sig  . ALPRAZolam (XANAX) 0.25 MG tablet Take 0.25 mg by mouth every 6 (six) hours as needed.  Marland Kitchen aspirin 81 MG chewable tablet Chew by mouth daily.  Marland Kitchen atorvastatin (LIPITOR) 80 MG tablet Take 80 mg by mouth daily.  . carvedilol (COREG) 6.25 MG tablet Take 6.25 mg by mouth 2 (two) times daily with a meal.  . celecoxib (CELEBREX) 200 MG capsule Take 200 mg by mouth 2 (two) times daily.  . cetirizine (ZYRTEC) 10 MG tablet Take 10 mg by mouth daily.  . clopidogrel (PLAVIX) 75 MG tablet TAKE ONE TABLET BY MOUTH ONCE DAILY  . glipiZIDE (GLUCOTROL XL) 5 MG 24 hr tablet Take 5 mg by mouth daily.  . metFORMIN (GLUCOPHAGE) 850 MG tablet Take 850 mg by mouth every morning.   . nitroGLYCERIN (NITROSTAT) 0.4 MG SL tablet Place 1 tablet (0.4 mg total) under the tongue every 5 (five) minutes as needed for chest pain.  . promethazine (PHENERGAN) 25 MG tablet Take 25 mg by mouth every 6 (six) hours as needed.  . ranolazine (RANEXA) 1000 MG SR tablet Take 1 tablet (1,000 mg total) by mouth 2 (two) times  daily.  Marland Kitchen spironolactone (ALDACTONE) 25 MG tablet TAKE ONE TABLET BY MOUTH ONCE DAILY  . tolterodine (DETROL LA) 4 MG 24 hr capsule Take 4 mg by mouth daily.     Allergies:   Losartan   Social History   Socioeconomic History  . Marital status: Married    Spouse name: Not on file  . Number of children: Not on file  . Years of education: Not on file  . Highest education level: Not on file  Occupational History  . Not on file  Tobacco Use  . Smoking status: Former Smoker    Types: Cigarettes    Quit date: 12/05/2017    Years since quitting: 2.3  . Smokeless tobacco: Never Used  Substance and Sexual Activity  . Alcohol use: Yes    Comment: occ  . Drug use: Never  . Sexual activity: Not on file  Other Topics Concern  . Not on file  Social History Narrative  . Not on file   Social Determinants of Health   Financial Resource Strain:   . Difficulty of Paying Living Expenses:   Food Insecurity:   . Worried About Charity fundraiser in the Last Year:   . Arboriculturist in the Last Year:   Transportation Needs:   . Film/video editor (Medical):   Marland Kitchen Lack of Transportation (Non-Medical):   Physical Activity:   . Days of Exercise per Week:   . Minutes of Exercise per Session:   Stress:   . Feeling of Stress :   Social Connections:   . Frequency of Communication with Friends and Family:   . Frequency of Social Gatherings with Friends and Family:   . Attends Religious Services:   . Active Member of Clubs or Organizations:   . Attends Archivist Meetings:   Marland Kitchen Marital Status:      Family History: The patient's family history includes Colon polyps in his brother, mother, and sister; Colon polyps (age of onset: 38) in his son; Familial polyposis in his son. There is no history of Colon cancer. ROS:   Please see the history of present illness.    All other systems reviewed and are negative.  EKGs/Labs/Other Studies Reviewed:    The following studies were  reviewed today:  EKG:  EKG from 03/18/2020 performed at his PCP office handcarried and left in the office for me to review shows sinus rhythm old anterior septal MI persistent ST elevation nonspecific ST changes 01/07/2020 independently reviewed by me Recent Labs: 01/06/2020: BUN 15; Creatinine, Ser 1.00; Hemoglobin 14.5; Platelets 289; Potassium 4.3; Sodium 139  Recent Lipid Panel No results found for: CHOL, TRIG, HDL, CHOLHDL, VLDL, LDLCALC, LDLDIRECT  Physical Exam:    VS:  BP 128/64   Pulse 68   Temp (!) 97.3 F (36.3 C)   Ht 5\' 8"  (1.727 m)   Wt 206 lb 6.4 oz (93.6 kg)   SpO2 96%   BMI 31.38 kg/m     Wt Readings from Last 3 Encounters:  03/30/20 206 lb 6.4 oz (93.6 kg)  01/08/20 209 lb (94.8 kg)  12/24/19 207 lb (93.9 kg)     GEN:  Well nourished, well developed in no acute distress HEENT: Normal NECK: No JVD; No carotid bruits LYMPHATICS: No lymphadenopathy CARDIAC: RRR, no murmurs, rubs, gallops RESPIRATORY:  Clear to auscultation without rales, wheezing or rhonchi  ABDOMEN: Soft, non-tender, non-distended MUSCULOSKELETAL:  No edema; No deformity  SKIN: Warm and dry NEUROLOGIC:  Alert and oriented x 3 PSYCHIATRIC:  Normal affect    Signed, [name], MD  03/31/2020 7:36 AM    [place]

## 2020-03-30 ENCOUNTER — Other Ambulatory Visit: Payer: Self-pay

## 2020-03-30 ENCOUNTER — Encounter: Payer: Self-pay | Admitting: Cardiology

## 2020-03-30 ENCOUNTER — Ambulatory Visit (INDEPENDENT_AMBULATORY_CARE_PROVIDER_SITE_OTHER): Payer: Managed Care, Other (non HMO) | Admitting: Cardiology

## 2020-03-30 VITALS — BP 128/64 | HR 68 | Temp 97.3°F | Ht 68.0 in | Wt 206.4 lb

## 2020-03-30 DIAGNOSIS — I255 Ischemic cardiomyopathy: Secondary | ICD-10-CM | POA: Diagnosis not present

## 2020-03-30 DIAGNOSIS — I10 Essential (primary) hypertension: Secondary | ICD-10-CM | POA: Diagnosis not present

## 2020-03-30 DIAGNOSIS — E782 Mixed hyperlipidemia: Secondary | ICD-10-CM

## 2020-03-30 DIAGNOSIS — I2510 Atherosclerotic heart disease of native coronary artery without angina pectoris: Secondary | ICD-10-CM

## 2020-03-30 NOTE — Patient Instructions (Signed)

## 2020-06-02 ENCOUNTER — Other Ambulatory Visit: Payer: Self-pay | Admitting: Cardiology

## 2020-06-24 ENCOUNTER — Encounter: Payer: Self-pay | Admitting: Genetic Counselor

## 2020-06-30 ENCOUNTER — Other Ambulatory Visit: Payer: Self-pay | Admitting: Cardiology

## 2020-09-08 ENCOUNTER — Telehealth: Payer: Self-pay | Admitting: Cardiology

## 2020-09-08 NOTE — Telephone Encounter (Signed)
Called patient informed him to go to the emergency department and scheduled him for an appointment with Dr. Bettina Gavia. Patient verbally understood. No further questions.

## 2020-09-08 NOTE — Telephone Encounter (Signed)
Called patient. He reports he has been having intermittent chest pain at night for the past 4 days. No pain currently on the call. He has been taking nitro and it helps the pain. Patient reports pain as central with no radiation. No shortness of breath associated with pain. He is also having nosebleeds from left nostril, this part has been going on for months though. Will consult with Dr. Harriet Masson. Patient was advised if pain returns or gets stronger to go to the emergency room. He doesn't really want to to this right now if he can help it, he also said he doesn't want another heart cath.

## 2020-09-08 NOTE — Telephone Encounter (Signed)
Pt c/o of Chest Pain: STAT if CP now or developed within 24 hours  1. Are you having CP right now? No - normally starts at night.  They started 4 days ago.    2. Are you experiencing any other symptoms (ex. SOB, nausea, vomiting, sweating)?  No other symptoms   3. How long have you been experiencing CP? Started about 4 days ago  4. Is your CP continuous or coming and going?  Continuous when they start.    5. Have you taken Nitroglycerin? Yes he has taken one at night for the last 4 days   Pt has been having nose bleeds every 2 or 3 days in the right Nausetrol.  Left one never bleeds   Best number [phone]  ?

## 2020-09-08 NOTE — Telephone Encounter (Signed)
Have the patient go to the emergency department for chest pain recorded.  He also will need to be scheduled to see Dr. Bettina Gavia

## 2020-09-16 ENCOUNTER — Ambulatory Visit: Payer: Managed Care, Other (non HMO) | Admitting: Cardiology

## 2020-10-28 ENCOUNTER — Ambulatory Visit: Payer: Managed Care, Other (non HMO) | Admitting: Gastroenterology

## 2020-11-03 ENCOUNTER — Telehealth: Payer: Self-pay | Admitting: Cardiology

## 2020-11-03 NOTE — Telephone Encounter (Signed)
We are OK I have the info in his medical record

## 2020-11-03 NOTE — Telephone Encounter (Signed)
Pt lost his wallet including the cards that were given to him after his stents were placed. Please advise on where to get replacement cards. Thanks/kbl

## 2020-11-03 NOTE — Telephone Encounter (Signed)
Spoke to the patient just now and let him know Dr. Joya Gaskins comments/recommendations. He verbalizes understanding and thanks me for the call back.

## 2020-12-05 HISTORY — PX: LITHOTRIPSY: SUR834

## 2020-12-05 HISTORY — PX: CYSTOSCOPY: SUR368

## 2020-12-09 ENCOUNTER — Telehealth: Payer: Self-pay

## 2020-12-09 ENCOUNTER — Ambulatory Visit (INDEPENDENT_AMBULATORY_CARE_PROVIDER_SITE_OTHER): Payer: Managed Care, Other (non HMO) | Admitting: Gastroenterology

## 2020-12-09 ENCOUNTER — Encounter: Payer: Self-pay | Admitting: Gastroenterology

## 2020-12-09 ENCOUNTER — Other Ambulatory Visit: Payer: Self-pay | Admitting: Cardiology

## 2020-12-09 VITALS — BP 110/76 | HR 96 | Ht 68.0 in | Wt 212.1 lb

## 2020-12-09 DIAGNOSIS — D126 Benign neoplasm of colon, unspecified: Secondary | ICD-10-CM

## 2020-12-09 DIAGNOSIS — Z8371 Family history of colonic polyps: Secondary | ICD-10-CM

## 2020-12-09 NOTE — Telephone Encounter (Signed)
[facility] Pre-operative Risk Assessment     Request for surgical clearance:     Endoscopy Procedure  What type of surgery is being performed?     EGD/Flex Sig   When is this surgery scheduled?     01/11/21  What type of clearance is required ?   Pharmacy  Are there any medications that need to be held prior to surgery and how long? Plavix 5 days   Practice name and name of physician performing surgery?      [facility]  What is your office phone and fax number?      Phone- [phone]  [phone]  Anesthesia type (None, local, MAC, general) ?       MAC

## 2020-12-09 NOTE — Patient Instructions (Signed)
If you are age 63 or older, your body mass index should be between 23-30. Your Body mass index is 32.25 kg/m. If this is out of the aforementioned range listed, please consider follow up with your Primary Care Provider.  If you are age 75 or younger, your body mass index should be between 19-25. Your Body mass index is 32.25 kg/m. If this is out of the aformentioned range listed, please consider follow up with your Primary Care Provider.   You have been scheduled for a flexible sigmoidoscopy. Please follow the written instructions given to you at your visit today. If you use inhalers (even only as needed), please bring them with you on the day of your procedure.  You have been scheduled for an endoscopy. Please follow written instructions given to you at your visit today. If you use inhalers (even only as needed), please bring them with you on the day of your procedure.  You will be contacted by our office prior to your procedure for directions on holding your Plavix.  If you do not hear from our office 1 week prior to your scheduled procedure, please call [phone] to discuss.    You have been scheduled for an abdominal ultrasound at [facility] (1st floor [address] ) on 01/07/21 at 10am. Please arrive 15 minutes prior to your appointment for registration. Make certain not to have anything to eat or drink 6 hours prior to your appointment. Should you need to reschedule your appointment, please contact radiology at [phone]. This test typically takes about 30 minutes to perform.   Thank you,  Dr. Lynann Bologna

## 2020-12-09 NOTE — Progress Notes (Signed)
Chief Complaint: FU  Referring Provider:  Simone Curia, MD      ASSESSMENT AND PLAN;   #1. 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 [place]). Positive for single, heterozygous pathogenic gene APC mutation (Q.[id number]>Q) 2019.  #2.  Comorbid conditions CAD s/p DES 12/2017 (getting off Brilinta June end 2020), HTN, HLD, LVD (EF 45-50%), DM2.  Plan: -FS/EGD with conventional and ERCP scope. Off plavix 5 days, after Dr cardio clearence from [facility] Feb 2022. -[place] abdo (if covered for screening) -Continue ASA throughout. -Continue Celebrex 200 mg p.o. twice daily.  HPI:    Larry Warner is a 65 y.o. male  With H/O CAD (s/p anterior STEMI with PCI/DES to LAD and LCx 12/2017),ICM(EF 45-50% 05/2019), HTN, HLD, DM2 For follow-up visit.  No nausea, vomiting, heartburn, regurgitation, odynophagia or dysphagia.  No significant diarrhea or constipation. There is no melena or hematochezia. No unintentional weight loss.  Has been under care of geneticist Clydie Braun as well.  Wt Readings from Last 3 Encounters:  12/09/20 212 lb 2 oz (96.2 kg)  03/30/20 206 lb 6.4 oz (93.6 kg)  01/08/20 209 lb (94.8 kg)     Past GI procedures: -Had several flexible sigmoidoscopies since surgery.  Was initially having it every 6 months.  Last FS 06/2019: Small external and internal hemorrhoids, otherwise normal to anastomosis. -Several EGDs.  Last EGD 01/04/2017 (EGD scope and ERCP scope)-mild gastritis.  Otherwise normal.  No periampullary tumors. Past Medical History:  Diagnosis Date  . Abnormal nuclear cardiac imaging test   . CAD (coronary artery disease) 02/28/2018  . 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 Dizziness 03/14/2018  . Essential hypertension 01/01/2018  . Familial adenomatous polyposis gene positive 12/28/2018   APC J.194+174Y>C  . Family history of colonic polyps   . Family history of FAP (familial adenomatous polyposis)   . Genetic testing 12/28/2018   APC  X.448+185U>D 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  The report date is 12/28/2018.  Marland Kitchen GERD (gastroesophageal reflux disease)   . Heart attack (HCC)   . History of colon polyps 12/17/2018  . Hyperglycemia 01/01/2018  . Hyperlipidemia   . Jaundice of recent onset 03/14/2018  . Left ventricular dysfunction 05/10/2019  . Nicotine abuse 01/01/2018  . Plantar fasciitis 07/13/2017  . ST elevation myocardial infarction involving left anterior descending (LAD) coronary artery (HCC) 01/01/2018   Formatting of this note might be different from the original. Added automatically from request for surgery [phone]  . Type 2 diabetes mellitus with circulatory disorder (HCC) 01/30/2018  . Unstable angina (HCC) 09/13/2019    Past Surgical History:  Procedure Laterality Date  . 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 ESOPHAGOGASTRODUODENOSCOPY  01/04/2017   Mild gastrtis. Otherwise normal EGD with convetional scope and ERCP scope. No periampullary tumors.   . Laprascopic assisted subtotal colectomy    . LEFT HEART CATH AND CORONARY ANGIOGRAPHY N/A 09/13/2019   Procedure: LEFT HEART CATH AND CORONARY ANGIOGRAPHY;  Surgeon: [place], Peter M, MD;  Location: [facility] INVASIVE CV LAB;  Service: Cardiovascular;  Laterality: N/A;  . WRIST SURGERY      Family History  Problem Relation Age of Onset  . Colon polyps Mother        <12 polyps  . Colon polyps Sister   . Colon polyps Brother        had colectomy due to polyp count  . Colon  polyps Son 26       total colectomy  . Familial polyposis Son        APC mutation  . Colon cancer Neg Hx     Social History   Tobacco Use  . Smoking status: Former Smoker    Types: Cigarettes    Quit date: 12/05/2017    Years since quitting: 3.0  . Smokeless tobacco: Never Used  Vaping Use  . Vaping Use: Never used  Substance Use Topics  . Alcohol use: Yes    Comment: occ  . Drug use: Never     Current Outpatient Medications  Medication Sig Dispense Refill  . ALPRAZolam (XANAX) 0.25 MG tablet Take 0.25 mg by mouth every 6 (six) hours as needed.    Marland Kitchen aspirin 81 MG chewable tablet Chew by mouth daily.    Marland Kitchen atorvastatin (LIPITOR) 80 MG tablet Take 80 mg by mouth daily.    . carvedilol (COREG) 6.25 MG tablet Take 6.25 mg by mouth 2 (two) times daily with a meal.    . celecoxib (CELEBREX) 200 MG capsule Take 200 mg by mouth 2 (two) times daily.    . cetirizine (ZYRTEC) 10 MG tablet Take 10 mg by mouth daily.    . clopidogrel (PLAVIX) 75 MG tablet TAKE ONE TABLET BY MOUTH ONCE DAILY 90 tablet 2  . glipiZIDE (GLUCOTROL XL) 5 MG 24 hr tablet Take 5 mg by mouth daily.    . isosorbide mononitrate (IMDUR) 60 MG 24 hr tablet Take 1 tablet (60 mg total) by mouth daily. 30 tablet 0  . metFORMIN (GLUCOPHAGE) 850 MG tablet Take 850 mg by mouth every morning.     Marland Kitchen omeprazole (PRILOSEC) 20 MG capsule Take 20 mg by mouth as needed.    . ranolazine (RANEXA) 1000 MG SR tablet TAKE ONE TABLET BY MOUTH TWICE DAILY 180 tablet 2  . tolterodine (DETROL LA) 4 MG 24 hr capsule Take 4 mg by mouth daily.    . nitroGLYCERIN (NITROSTAT) 0.4 MG SL tablet Place 1 tablet (0.4 mg total) under the tongue every 5 (five) minutes as needed for chest pain. (Patient not taking: Reported on 12/09/2020) 25 tablet 3  . spironolactone (ALDACTONE) 25 MG tablet TAKE ONE TABLET BY MOUTH EVERY DAY 90 tablet 0   No current facility-administered medications for this visit.    Allergies  Allergen Reactions  . Losartan Rash    Review of Systems:  neg     Physical Exam:    BP 110/76   Pulse 96   Ht 5\' 8"  (1.727 m)   Wt 212 lb 2 oz (96.2 kg)   BMI 32.25 kg/m  Filed Weights   12/09/20 1540  Weight: 212 lb 2 oz (96.2 kg)  Gen: awake, alert, NAD HEENT: anicteric, no pallor CV: RRR, no mrg Pulm: CTA b/l Abd: soft, NT/ND, +BS throughout Ext: no c/c/e Neuro: nonfocal    Larry Austria, MD 12/09/2020, 3:56 PM  Cc:  [name], MD

## 2020-12-10 NOTE — Telephone Encounter (Signed)
He can stop his antiplatelet agents for endoscopic procedure, would like to see him back on aspirin 1 to 2 days later and clopidogrel when safe from a bleeding perspective

## 2020-12-10 NOTE — Telephone Encounter (Signed)
   Primary Cardiologist: Norman Herrlich, MD  Chart reviewed as part of pre-operative protocol coverage. Given past medical history and time since last visit, based on ACC/AHA guidelines, Larry Warner would be at acceptable risk for the planned procedure without further cardiovascular testing. He is scheduled for close follow up with Dr. Dulce Sellar.   In regards to his Plavix and ASA, per Dr. Dulce Sellar he may stop these for the endoscopic procedure, however he would like to see him back on aspirin 1 to 2 days later and clopidogrel when safe from a bleeding perspective  The patient was advised that if he develops new symptoms prior to surgery to contact our office to arrange for a follow-up visit, and he verbalized understanding.  I will route this recommendation to the requesting party via Epic fax function and remove from pre-op pool.  Please call with questions.  Georgie Chard, NP 12/10/2020, 8:53 AM

## 2020-12-16 DIAGNOSIS — I219 Acute myocardial infarction, unspecified: Secondary | ICD-10-CM | POA: Insufficient documentation

## 2020-12-16 DIAGNOSIS — K219 Gastro-esophageal reflux disease without esophagitis: Secondary | ICD-10-CM | POA: Insufficient documentation

## 2020-12-22 NOTE — Progress Notes (Signed)
Cardiology Office Note:    Date:  12/24/2020   ID:  Larry Warner, DOB [birth year], MRN [id number]  PCP:  [name], MD  Cardiologist:  [name], MD    Referring MD: [name], MD    ASSESSMENT:    1. Coronary artery disease involving native coronary artery of native heart without angina pectoris   2. Ischemic cardiomyopathy   3. Essential hypertension   4. Mixed hyperlipidemia    PLAN:    In order of problems listed above:  1. Overall he has done well stable CAD New York Heart Association class I continue current treatment he is on aspirin and clopidogrel tolerates well without bleeding along with oral nitrates and ranolazine and lipid-lowering treatment.  At this time I would not repeat ischemia evaluation 2. Varying ejection fractions recheck echocardiogram if EF is less than 40 I would cautiously try Entresto and I would change him to Toprol-XL at a minimum dose to avoid hypotension continue current treatment including his MRA and if additional diabetic therapy is needed SGLT2 inhibitor would be appropriate 3. Stable all blood pressures left arm 4. Lipids at target continue his high intensity statin   Next appointment: 6 months   Medication Adjustments/Labs and Tests Ordered: Current medicines are reviewed at length with the patient today.  Concerns regarding medicines are outlined above.  Orders Placed This Encounter  Procedures  . EKG 12-Lead   No orders of the defined types were placed in this encounter.   Chief Complaint  Patient presents with  . Follow-up  . Coronary Artery Disease  . Cardiomyopathy    EF in the range 40% October 2020 on coronary angiography and ventriculography and 45 to 50% in June 2020 by echo    History of Present Illness:    Larry Warner is a 65 y.o. male with a hx of CAD status post anterior MI with PCI and drug-eluting stent to the LAD and left circumflex 01/01/2018 ischemic cardiomyopathy EF 45 to 50% in June 2020  hypertension hyperlipidemia and type 2 diabetes.  He presented to [facility] ED 09/10/2019 and was transferred to [facility] for cardiac catheterization.  Stents in the proximal LAD and mid left circumflex were patent and there was occlusion of the left PDA with collaterals and a small vessel felt to be too small for PCI ejection fraction was 40% at the time.  He was last seen 03/30/2020.  He is allergic to ARB with rash noted during hospitalization October 2020.  Chart review shows sometime in March 2019 losartan was discontinued.  From reading office notes it seems like the issue with symptomatic hypotension.  And is not listed as an allergy in the [facility] system.Marland Kitchen    He is scheduled for endoscopy 01/11/2021  Compliance with diet, lifestyle and medications: Yes  From a cardiology perspective has done well no angina dyspnea palpitation or syncope but unfortunately his diabetes is poorly controlled recent A1c 8.2% other labs from that date 12/17/2020 cholesterol at target 149 LDL at target 83 triglycerides 153 HDL 39 normal liver function test potassium 4.8.  He is scheduled for colonoscopy and has instructions avoid but withdrawal of antiplatelet agents.  I reviewed his history his ARB was stopped because of low blood pressure he did not have allergy.  Left heart catheterization 09/13/2019:  Previously placed Prox LAD to Mid LAD stent (unknown type) is widely patent.  Previously placed Prox Cx to Dist Cx stent (unknown type) is widely patent.  Ramus lesion is 100% stenosed.  LPAV lesion is 100% stenosed with 100% stenosed side branch in LPDA.  There is moderate left ventricular systolic dysfunction.  LV end diastolic pressure is normal.  The left ventricular ejection fraction is 35-45% by visual estimate.   1. CAD. Left dominant circulation    - Widely patent stent in the proximal LAD    - Widely patent stent in the mid LCx    - a tiny, wispy intermediate branch is  occluded    - 100% proximal left PDA. There are collaterals to this vessel. The vessel is small in caliber 2. Moderate LV dysfunction. EF estimated at 40%.  3. Normal LVEDP   Plan; recommend medical management. It is unclear from old notes if PDA occlusion is old or more recent  Past Medical History:  Diagnosis Date  . Abnormal nuclear cardiac imaging test   . CAD (coronary artery disease) 02/28/2018  . 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 Dizziness 03/14/2018  . Essential hypertension 01/01/2018  . Familial adenomatous polyposis gene positive 12/28/2018   APC O.536+644I>H  . Family history of colonic polyps   . Family history of FAP (familial adenomatous polyposis)   . Genetic testing 12/28/2018   APC K.742+595G>L 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  The report date is 12/28/2018.  Marland Kitchen GERD (gastroesophageal reflux disease)   . Heart attack ([place])   . History of colon polyps 12/17/2018  . Hyperglycemia 01/01/2018  . Hyperlipidemia   . Jaundice of recent onset 03/14/2018  . Left ventricular dysfunction 05/10/2019  . Nicotine abuse 01/01/2018  . Plantar fasciitis 07/13/2017  . ST elevation myocardial infarction involving left anterior descending (LAD) coronary artery ([place]) 01/01/2018   Formatting of this note might be different from the original. Added automatically from request for surgery [phone]  . Type 2 diabetes mellitus with circulatory disorder ([place]) 01/30/2018  . Unstable angina ([place]) 09/13/2019    Past Surgical History:  Procedure Laterality Date  . 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 ESOPHAGOGASTRODUODENOSCOPY  01/04/2017   Mild gastrtis. Otherwise normal EGD with convetional scope and ERCP scope. No periampullary tumors.   . Laprascopic assisted subtotal colectomy    . LEFT HEART CATH AND CORONARY ANGIOGRAPHY N/A 09/13/2019   Procedure: LEFT HEART CATH AND CORONARY ANGIOGRAPHY;   Surgeon: Martinique, Peter M, MD;  Location: [facility] CV LAB;  Service: Cardiovascular;  Laterality: N/A;  . WRIST SURGERY      Current Medications: Current Meds  Medication Sig  . ALPRAZolam (XANAX) 0.25 MG tablet Take 0.25 mg by mouth every 6 (six) hours as needed.  Marland Kitchen aspirin 81 MG chewable tablet Chew by mouth daily.  Marland Kitchen atorvastatin (LIPITOR) 80 MG tablet Take 80 mg by mouth daily.  . carvedilol (COREG) 6.25 MG tablet Take 6.25 mg by mouth 2 (two) times daily with a meal.  . celecoxib (CELEBREX) 200 MG capsule Take 200 mg by mouth 2 (two) times daily.  . cetirizine (ZYRTEC) 10 MG tablet Take 10 mg by mouth daily.  . clopidogrel (PLAVIX) 75 MG tablet TAKE ONE TABLET BY MOUTH ONCE DAILY  . glimepiride (AMARYL) 4 MG tablet Take 4 mg by mouth 2 (two) times daily.  . isosorbide mononitrate (IMDUR) 60 MG 24 hr tablet Take 1 tablet (60 mg total) by mouth daily.  . metFORMIN (GLUCOPHAGE) 850 MG tablet Take 850 mg by mouth every morning.   . nitroGLYCERIN (  NITROSTAT) 0.4 MG SL tablet Place 1 tablet (0.4 mg total) under the tongue every 5 (five) minutes as needed for chest pain.  Marland Kitchen omeprazole (PRILOSEC) 20 MG capsule Take 20 mg by mouth as needed.  . ranolazine (RANEXA) 1000 MG SR tablet TAKE ONE TABLET BY MOUTH TWICE DAILY  . spironolactone (ALDACTONE) 25 MG tablet TAKE ONE TABLET BY MOUTH EVERY DAY  . tolterodine (DETROL LA) 4 MG 24 hr capsule Take 4 mg by mouth daily.     Allergies:   Losartan   Social History   Socioeconomic History  . Marital status: Married    Spouse name: Not on file  . Number of children: Not on file  . Years of education: Not on file  . Highest education level: Not on file  Occupational History  . Not on file  Tobacco Use  . Smoking status: Former Smoker    Types: Cigarettes    Quit date: 12/05/2017    Years since quitting: 3.0  . Smokeless tobacco: Never Used  Vaping Use  . Vaping Use: Never used  Substance and Sexual Activity  . Alcohol use: Yes     Comment: occ  . Drug use: Never  . Sexual activity: Not on file  Other Topics Concern  . Not on file  Social History Narrative  . Not on file   Social Determinants of Health   Financial Resource Strain: Not on file  Food Insecurity: Not on file  Transportation Needs: Not on file  Physical Activity: Not on file  Stress: Not on file  Social Connections: Not on file     Family History: The patient's family history includes Colon polyps in his brother, mother, and sister; Colon polyps (age of onset: 24) in his son; Familial polyposis in his son. There is no history of Colon cancer. ROS:   Please see the history of present illness.    All other systems reviewed and are negative.  EKGs/Labs/Other Studies Reviewed:    The following studies were reviewed today:  EKG:  EKG ordered today and personally reviewed.  The ekg ordered today demonstrates sinus rhythm old anterior septal MI   Physical Exam:    VS:  BP 110/66 (BP Location: Left Arm, Patient Position: Sitting)   Pulse 83   Ht 5\' 8"  (1.727 m)   Wt 209 lb 12.8 oz (95.2 kg)   SpO2 95%   BMI 31.90 kg/m     Wt Readings from Last 3 Encounters:  12/24/20 209 lb 12.8 oz (95.2 kg)  12/09/20 212 lb 2 oz (96.2 kg)  03/30/20 206 lb 6.4 oz (93.6 kg)     GEN:  Well nourished, well developed in no acute distress HEENT: Normal NECK: No JVD; No carotid bruits LYMPHATICS: No lymphadenopathy CARDIAC: RRR, no murmurs, rubs, gallops RESPIRATORY:  Clear to auscultation without rales, wheezing or rhonchi  ABDOMEN: Soft, non-tender, non-distended MUSCULOSKELETAL:  No edema; No deformity  SKIN: Warm and dry NEUROLOGIC:  Alert and oriented x 3 PSYCHIATRIC:  Normal affect    Signed, [name], MD  12/24/2020 9:10 AM    [place]

## 2020-12-24 ENCOUNTER — Encounter: Payer: Self-pay | Admitting: Cardiology

## 2020-12-24 ENCOUNTER — Ambulatory Visit (INDEPENDENT_AMBULATORY_CARE_PROVIDER_SITE_OTHER): Payer: Managed Care, Other (non HMO) | Admitting: Cardiology

## 2020-12-24 ENCOUNTER — Other Ambulatory Visit: Payer: Self-pay

## 2020-12-24 VITALS — BP 110/66 | HR 83 | Ht 68.0 in | Wt 209.8 lb

## 2020-12-24 DIAGNOSIS — I255 Ischemic cardiomyopathy: Secondary | ICD-10-CM | POA: Diagnosis not present

## 2020-12-24 DIAGNOSIS — I2510 Atherosclerotic heart disease of native coronary artery without angina pectoris: Secondary | ICD-10-CM

## 2020-12-24 DIAGNOSIS — E782 Mixed hyperlipidemia: Secondary | ICD-10-CM

## 2020-12-24 DIAGNOSIS — I10 Essential (primary) hypertension: Secondary | ICD-10-CM | POA: Diagnosis not present

## 2020-12-24 NOTE — Patient Instructions (Signed)

## 2020-12-29 ENCOUNTER — Encounter (HOSPITAL_COMMUNITY): Payer: Self-pay | Admitting: Gastroenterology

## 2020-12-29 ENCOUNTER — Other Ambulatory Visit: Payer: Self-pay

## 2021-01-07 ENCOUNTER — Other Ambulatory Visit: Payer: Self-pay

## 2021-01-07 ENCOUNTER — Ambulatory Visit (HOSPITAL_BASED_OUTPATIENT_CLINIC_OR_DEPARTMENT_OTHER)
Admission: RE | Admit: 2021-01-07 | Discharge: 2021-01-07 | Disposition: A | Discharge status: Home / Self Care | Payer: Managed Care, Other (non HMO) | Source: Ambulatory Visit | Attending: Gastroenterology | Admitting: Gastroenterology

## 2021-01-07 ENCOUNTER — Other Ambulatory Visit (HOSPITAL_COMMUNITY)
Admission: RE | Admit: 2021-01-07 | Discharge: 2021-01-07 | Disposition: A | Discharge status: Home / Self Care | Payer: Managed Care, Other (non HMO) | Source: Ambulatory Visit | Attending: Gastroenterology | Admitting: Gastroenterology

## 2021-01-07 DIAGNOSIS — Z20822 Contact with and (suspected) exposure to covid-19: Secondary | ICD-10-CM | POA: Insufficient documentation

## 2021-01-07 DIAGNOSIS — Z01812 Encounter for preprocedural laboratory examination: Secondary | ICD-10-CM | POA: Insufficient documentation

## 2021-01-07 DIAGNOSIS — D126 Benign neoplasm of colon, unspecified: Secondary | ICD-10-CM | POA: Diagnosis present

## 2021-01-07 DIAGNOSIS — Z8371 Family history of colonic polyps: Secondary | ICD-10-CM | POA: Diagnosis not present

## 2021-01-07 LAB — SARS CORONAVIRUS 2 (TAT 6-24 HRS): SARS Coronavirus 2: NEGATIVE

## 2021-01-10 NOTE — Anesthesia Preprocedure Evaluation (Addendum)
Anesthesia Evaluation  Patient identified by MRN, date of birth, ID band Patient awake    Reviewed: Allergy & Precautions, NPO status , Patient's Chart, lab work & pertinent test results  Airway Mallampati: II  TM Distance: >3 FB Neck ROM: Full    Dental  (+) Dental Advisory Given, Teeth Intact   Pulmonary former smoker,    Pulmonary exam normal breath sounds clear to auscultation       Cardiovascular hypertension, Pt. on medications and Pt. on home beta blockers + angina + CAD, + Past MI and + Cardiac Stents  Normal cardiovascular exam Rhythm:Regular Rate:Normal  Cath 09/2019  Previously placed Prox LAD to Mid LAD stent (unknown type) is widely patent.  Previously placed Prox Cx to Dist Cx stent (unknown type) is widely patent.  Ramus lesion is 100% stenosed.  LPAV lesion is 100% stenosed with 100% stenosed side branch in LPDA.  There is moderate left ventricular systolic dysfunction.  LV end diastolic pressure is normal.  The left ventricular ejection fraction is 35-45% by visual estimate.   1. CAD. Left dominant circulation    - Widely patent stent in the proximal LAD    - Widely patent stent in the mid LCx    - a tiny, wispy intermediate branch is occluded    - 100% proximal left PDA. There are collaterals to this vessel. The vessel is small in caliber 2. Moderate LV dysfunction. EF estimated at 40%.  3. Normal LVEDP    Neuro/Psych negative neurological ROS     GI/Hepatic Neg liver ROS, GERD  ,  Endo/Other  diabetes  Renal/GU negative Renal ROS     Musculoskeletal negative musculoskeletal ROS (+)   Abdominal   Peds  Hematology negative hematology ROS (+)   Anesthesia Other Findings   Reproductive/Obstetrics                            Anesthesia Physical Anesthesia Plan  ASA: III  Anesthesia Plan: MAC   Post-op Pain Management:    Induction: Intravenous  PONV  Risk Score and Plan: 1 and Propofol infusion and Treatment may vary due to age or medical condition  Airway Management Planned: Natural Airway  Additional Equipment: None  Intra-op Plan:   Post-operative Plan:   Informed Consent: I have reviewed the patients History and Physical, chart, labs and discussed the procedure including the risks, benefits and alternatives for the proposed anesthesia with the patient or authorized representative who has indicated his/her understanding and acceptance.     Dental advisory given  Plan Discussed with:   Anesthesia Plan Comments:        Anesthesia Quick Evaluation

## 2021-01-11 ENCOUNTER — Ambulatory Visit (HOSPITAL_COMMUNITY): Payer: Managed Care, Other (non HMO) | Admitting: Anesthesiology

## 2021-01-11 ENCOUNTER — Ambulatory Visit (HOSPITAL_COMMUNITY)
Admission: RE | Admit: 2021-01-11 | Discharge: 2021-01-11 | Disposition: A | Discharge status: Home / Self Care | Payer: Managed Care, Other (non HMO) | Attending: Gastroenterology | Admitting: Gastroenterology

## 2021-01-11 ENCOUNTER — Encounter (HOSPITAL_COMMUNITY)
Admission: RE | Disposition: A | Discharge status: Home / Self Care | Payer: Self-pay | Source: Home / Self Care | Attending: Gastroenterology

## 2021-01-11 ENCOUNTER — Encounter (HOSPITAL_COMMUNITY): Payer: Self-pay | Admitting: Gastroenterology

## 2021-01-11 ENCOUNTER — Other Ambulatory Visit: Payer: Self-pay

## 2021-01-11 DIAGNOSIS — K2970 Gastritis, unspecified, without bleeding: Secondary | ICD-10-CM | POA: Diagnosis not present

## 2021-01-11 DIAGNOSIS — D126 Benign neoplasm of colon, unspecified: Secondary | ICD-10-CM

## 2021-01-11 DIAGNOSIS — Z86010 Personal history of colonic polyps: Secondary | ICD-10-CM | POA: Insufficient documentation

## 2021-01-11 DIAGNOSIS — Z87891 Personal history of nicotine dependence: Secondary | ICD-10-CM | POA: Diagnosis not present

## 2021-01-11 DIAGNOSIS — Z8371 Family history of colonic polyps: Secondary | ICD-10-CM | POA: Insufficient documentation

## 2021-01-11 DIAGNOSIS — K644 Residual hemorrhoidal skin tags: Secondary | ICD-10-CM | POA: Diagnosis not present

## 2021-01-11 DIAGNOSIS — Z1211 Encounter for screening for malignant neoplasm of colon: Secondary | ICD-10-CM | POA: Insufficient documentation

## 2021-01-11 DIAGNOSIS — Z79899 Other long term (current) drug therapy: Secondary | ICD-10-CM | POA: Insufficient documentation

## 2021-01-11 DIAGNOSIS — D1391 Familial adenomatous polyposis: Secondary | ICD-10-CM

## 2021-01-11 DIAGNOSIS — Z13810 Encounter for screening for upper gastrointestinal disorder: Secondary | ICD-10-CM | POA: Diagnosis not present

## 2021-01-11 DIAGNOSIS — Z7984 Long term (current) use of oral hypoglycemic drugs: Secondary | ICD-10-CM | POA: Diagnosis not present

## 2021-01-11 DIAGNOSIS — Z7902 Long term (current) use of antithrombotics/antiplatelets: Secondary | ICD-10-CM | POA: Insufficient documentation

## 2021-01-11 DIAGNOSIS — Z9049 Acquired absence of other specified parts of digestive tract: Secondary | ICD-10-CM | POA: Diagnosis not present

## 2021-01-11 DIAGNOSIS — K648 Other hemorrhoids: Secondary | ICD-10-CM | POA: Insufficient documentation

## 2021-01-11 DIAGNOSIS — Z7982 Long term (current) use of aspirin: Secondary | ICD-10-CM | POA: Insufficient documentation

## 2021-01-11 HISTORY — DX: Presence of spectacles and contact lenses: Z97.3

## 2021-01-11 HISTORY — PX: ESOPHAGOGASTRODUODENOSCOPY (EGD) WITH PROPOFOL: SHX5813

## 2021-01-11 HISTORY — PX: FLEXIBLE SIGMOIDOSCOPY: SHX5431

## 2021-01-11 HISTORY — PX: BIOPSY: SHX5522

## 2021-01-11 LAB — GLUCOSE, CAPILLARY: Glucose-Capillary: 160 mg/dL — ABNORMAL HIGH (ref 70–99)

## 2021-01-11 SURGERY — ESOPHAGOGASTRODUODENOSCOPY (EGD) WITH PROPOFOL
Anesthesia: Monitor Anesthesia Care

## 2021-01-11 MED ORDER — PROPOFOL 500 MG/50ML IV EMUL
INTRAVENOUS | Status: DC | PRN
  Administered 2021-01-11: 150 ug/kg/min via INTRAVENOUS

## 2021-01-11 MED ORDER — LACTATED RINGERS IV SOLN
INTRAVENOUS | Status: DC
  Administered 2021-01-11: 1000 mL via INTRAVENOUS

## 2021-01-11 MED ORDER — PROPOFOL 10 MG/ML IV BOLUS
INTRAVENOUS | Status: DC | PRN
  Administered 2021-01-11 (×3): 10 mg via INTRAVENOUS

## 2021-01-11 MED ORDER — SODIUM CHLORIDE 0.9 % IV SOLN: INTRAVENOUS | Status: DC

## 2021-01-11 MED ORDER — LIDOCAINE HCL 1 % IJ SOLN
INTRAMUSCULAR | Status: DC | PRN
  Administered 2021-01-11: 50 mg via INTRADERMAL

## 2021-01-11 MED ORDER — PROPOFOL 500 MG/50ML IV EMUL
INTRAVENOUS | Status: AC
  Filled 2021-01-11: qty 50

## 2021-01-11 SURGICAL SUPPLY — 15 items

## 2021-01-11 NOTE — Transfer of Care (Signed)
Immediate Anesthesia Transfer of Care Note  Patient: Larry Warner.  Procedure(s) Performed: ESOPHAGOGASTRODUODENOSCOPY (EGD) WITH PROPOFOL (N/A ) FLEXIBLE SIGMOIDOSCOPY (N/A ) BIOPSY  Patient Location: PACU and Endoscopy Unit  Anesthesia Type:MAC  Level of Consciousness: oriented, drowsy and patient cooperative  Airway & Oxygen Therapy: Patient Spontanous Breathing and Patient connected to face mask oxygen  Post-op Assessment: Report given to RN and Post -op Vital signs reviewed and stable  Post vital signs: Reviewed and stable  Last Vitals:  Vitals Value Taken Time  BP 88/48 01/11/21 0802  Temp    Pulse 60 01/11/21 0804  Resp 22 01/11/21 0804  SpO2 98 % 01/11/21 0804  Vitals shown include unvalidated device data.  Last Pain:  Vitals:   01/11/21 0640  TempSrc: Oral  PainSc: 0-No pain         Complications: No complications documented.

## 2021-01-11 NOTE — Op Note (Signed)
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 [place]). 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 Providers:                Jackquline Denmark, MD, Kary Kos RN, RN, Tyrone Apple, Technician, Karis Juba, CRNA Referring MD:              Medicines:                Monitored Anesthesia Care Complications:            No immediate complications. Estimated Blood Loss:     Estimated blood loss: none. Procedure:                Pre-Anesthesia Assessment:                           - Prior to the procedure, a History and Physical                            was performed, and patient medications and                            allergies were reviewed. The patient's tolerance of                            previous anesthesia was also reviewed. The risks                            and benefits of the procedure and the sedation                            options and risks were discussed with the patient.                            All questions were answered, and informed consent                            was obtained. Prior Anticoagulants:  The patient has                            taken Plavix (clopidogrel), last dose was 5 days                            prior to procedure. ASA Grade Assessment: II - A                            patient with mild systemic disease. After reviewing                            the risks and benefits, the patient was deemed in                             satisfactory condition to undergo the procedure.                           After obtaining informed consent, the scope was                            passed under direct vision. The [id number]-H190DL                            ([id number]) Olympus pediatric colonscope was                            introduced through the anus and advanced to the the                            ileo-rectal anastomosis. The flexible sigmoidoscopy                            was accomplished without difficulty. The patient                            tolerated the procedure well. The quality of the                            bowel preparation was adequate to identify polyps 6                            mm and larger in size. Scope In: 7:51:16 AM Scope Out: 7:56:05 AM Total Procedure Duration: 0 hours 4 minutes 49 seconds  Findings:      External hemorrhoids with few anal tags were found on perianal exam.      The colon (entire examined portion) appeared normal to anastomosis, 15       cm from anal verge. The exam was to some extent limited due to quality       of preparation. There was retained solid vegetable material which could       not be fully washed.      Non-bleeding internal hemorrhoids were found during retroflexion. The       hemorrhoids were small.  The exam was otherwise without abnormality. Impression:               -Internal and external hemorrhoids.                           -Otherwise normal flexible sigmoidoscopy to                            ileorectal anastomosis at 15 cm.                           -No specimens collected. Moderate Sedation:      Not Applicable - Patient had care per Anesthesia. Recommendation:           - Discharge patient to home.                           - Use Preparation H on as-needed basis.                           - Resume Plavix (clopidogrel) at prior dose                            tomorrow.                           - The findings and recommendations were discussed                             with Randall Hiss.                           - Repeat flexible sigmoidoscopy in 1 year for                            screening purposes. Procedure Code(s):        --- Professional ---                           Q6834, Colorectal cancer screening; flexible                            sigmoidoscopy Diagnosis Code(s):        --- Professional ---                           Z86.010, Personal history of colonic polyps                           K64.8, Other hemorrhoids CPT copyright 2019 American Medical Association. All rights reserved. The codes documented in this report are preliminary and upon coder review may  be revised to meet current compliance requirements. Jackquline Denmark, MD 01/11/2021 8:14:16 AM This report has been signed electronically. Number of Addenda: 0

## 2021-01-11 NOTE — Op Note (Signed)
[facility] Patient Name: Larry Warner Procedure Date: 01/11/2021 MRN: [id number] Attending MD: Jackquline Denmark , MD Date of Birth: 28-Jul-1956 CSN: [id number] Age: 65 Admit Type: Outpatient Procedure:                Upper GI endoscopy Indications:              Screening procedure- FAP for screening for upper GI                            lesions. Providers:                Jackquline Denmark, MD, Kary Kos RN, RN, Tyrone Apple, Technician, Karis Juba, CRNA Referring MD:              Medicines:                Monitored Anesthesia Care Complications:            No immediate complications. Estimated Blood Loss:     Estimated blood loss: none. Procedure:                Pre-Anesthesia Assessment:                           - Prior to the procedure, a History and Physical                            was performed, and patient medications and                            allergies were reviewed. The patient's tolerance of                            previous anesthesia was also reviewed. The risks                            and benefits of the procedure and the sedation                            options and risks were discussed with the patient.                            All questions were answered, and informed consent                            was obtained. Prior Anticoagulants: The patient has                            taken Plavix (clopidogrel), last dose was 5 days                            prior to procedure. ASA Grade Assessment: II - A  patient with mild systemic disease. After reviewing                            the risks and benefits, the patient was deemed in                            satisfactory condition to undergo the procedure.                           After obtaining informed consent, the endoscope was                            passed under direct vision. Throughout the                             procedure, the patient's blood pressure, pulse, and                            oxygen saturations were monitored continuously. The                            GIF-H190 ([id number]) Olympus gastroscope was                            introduced through the mouth, and advanced to the                            second part of duodenum. The [id number]-[id number] ([id number])                            Olympus Duodenoscope was introduced through the and                            advanced to the second portion of the duodenum. The                            upper GI endoscopy was accomplished without                            difficulty. The patient tolerated the procedure                            well. Scope In: Scope Out: Findings:      The examined esophagus was normal with well-defined Z-line at 38 cm.      Localized mild inflammation characterized by erythema was found in the       gastric antrum. Biopsies were taken with a cold forceps for histology.      The examined duodenum was normal.      Thereafter the ERCP scope was inserted and the second portion of the       duodenum examined. The ampulla was normal. Photodocumentation was       obtained. Impression:               -Minimal gastritis.                           -  Otherwise normal EGD. No periampullary lesions                            noted with side-viewing ERCP scope Moderate Sedation:      Not Applicable - Patient had care per Anesthesia. Recommendation:           - Patient has a contact number available for                            emergencies. The signs and symptoms of potential                            delayed complications were discussed with the                            patient. Return to normal activities tomorrow.                            Written discharge instructions were provided to the                            patient.                           - Resume previous diet.                           - Continue present  medications.                           - Await pathology results.                           - Repeat upper endoscopy in 3 years for screening                            purposes. Earlier, if with any new problems.                           - Return to GI clinic in 1 year.                           - D/W Eric Procedure Code(s):        --- Professional ---                           [phone], Esophagogastroduodenoscopy, flexible,                            transoral; with biopsy, single or multiple Diagnosis Code(s):        --- Professional ---                           K29.70, Gastritis, unspecified, without bleeding  Z13.810, Encounter for screening for upper                            gastrointestinal disorder CPT copyright 2019 American Medical Association. All rights reserved. The codes documented in this report are preliminary and upon coder review may  be revised to meet current compliance requirements. Jackquline Denmark, MD 01/11/2021 8:05:58 AM This report has been signed electronically. Number of Addenda: 0

## 2021-01-11 NOTE — Anesthesia Postprocedure Evaluation (Signed)
Anesthesia Post Note  Patient: Larry Warner.  Procedure(s) Performed: ESOPHAGOGASTRODUODENOSCOPY (EGD) WITH PROPOFOL (N/A ) FLEXIBLE SIGMOIDOSCOPY (N/A ) BIOPSY     Patient location during evaluation: PACU Anesthesia Type: MAC Level of consciousness: awake and alert Pain management: pain level controlled Vital Signs Assessment: post-procedure vital signs reviewed and stable Respiratory status: spontaneous breathing Cardiovascular status: stable Anesthetic complications: no   No complications documented.  Last Vitals:  Vitals:   01/11/21 0810 01/11/21 0820  BP: 91/62   Pulse: 61 62  Resp: 14 14  Temp:    SpO2: 94% 95%    Last Pain:  Vitals:   01/11/21 0820  TempSrc:   PainSc: 0-No pain                 Nolon Nations

## 2021-01-11 NOTE — H&P (Signed)
[] Hover for details           For EGD/FS today RG  Chief Complaint: FU  Referring Provider:  [name], MD      ASSESSMENT AND PLAN;   #1. 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 [place]). 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  #2.  Comorbid conditions CAD s/p DES 12/2017 (getting off Brilinta June end 2020), HTN, HLD, LVD (EF 45-50%), DM2.  Plan: -FS/EGD with conventional and ERCP scope. Off plavix 5 days, after Dr cardio clearence from [facility]. Feb 2022. -[place] abdo (if covered for screening) -Continue ASA throughout. -Continue Celebrex 200 mg p.o. twice daily.  HPI:    Larry Warner is a 65 y.o. male  With H/O CAD (s/p anterior STEMI with PCI/DES to LAD and LCx 12/2017),ICM(EF 45-50% 05/2019), HTN, HLD, DM2 For follow-up visit.  No nausea, vomiting, heartburn, regurgitation, odynophagia or dysphagia.  No significant diarrhea or constipation. There is no melena or hematochezia. No unintentional weight loss.  Has been under care of geneticist Santiago Glad as well.     Wt Readings from Last 3 Encounters:  12/09/20 212 lb 2 oz (96.2 kg)  03/30/20 206 lb 6.4 oz (93.6 kg)  01/08/20 209 lb (94.8 kg)     Past GI procedures: -Had several flexible sigmoidoscopies since surgery.  Was initially having it every 6 months.  Last FS 06/2019: Small external and internal hemorrhoids, otherwise normal to anastomosis. -Several EGDs.  Last EGD 01/04/2017 (EGD scope and ERCP scope)-mild gastritis.  Otherwise normal.  No periampullary tumors.     Past Medical History:  Diagnosis Date  . Abnormal nuclear cardiac imaging test   . CAD (coronary artery disease) 02/28/2018  . 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 Dizziness 03/14/2018  . Essential hypertension 01/01/2018  . Familial adenomatous polyposis gene positive 12/28/2018   APC B.147+829F>A  . Family history of colonic polyps   . Family  history of FAP (familial adenomatous polyposis)   . Genetic testing 12/28/2018   APC O.130+865H>Q 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  The report date is 12/28/2018.  Marland Kitchen GERD (gastroesophageal reflux disease)   . Heart attack ([place])   . History of colon polyps 12/17/2018  . Hyperglycemia 01/01/2018  . Hyperlipidemia   . Jaundice of recent onset 03/14/2018  . Left ventricular dysfunction 05/10/2019  . Nicotine abuse 01/01/2018  . Plantar fasciitis 07/13/2017  . ST elevation myocardial infarction involving left anterior descending (LAD) coronary artery ([place]) 01/01/2018   Formatting of this note might be different from the original. Added automatically from request for surgery [phone]  . Type 2 diabetes mellitus with circulatory disorder ([place]) 01/30/2018  . Unstable angina ([place]) 09/13/2019    Past Surgical History:  Procedure Laterality Date  . 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 ESOPHAGOGASTRODUODENOSCOPY  01/04/2017   Mild gastrtis. Otherwise normal EGD with convetional scope and ERCP scope. No periampullary tumors.   . Laprascopic assisted subtotal colectomy    . LEFT HEART CATH AND CORONARY ANGIOGRAPHY N/A 09/13/2019   Procedure: LEFT HEART CATH AND CORONARY ANGIOGRAPHY;  Surgeon: Martinique, Peter M, MD;  Location: [facility];  Service: Cardiovascular;  Laterality: N/A;  . WRIST SURGERY           Family History  Problem Relation Age of Onset  . Colon polyps Mother        <12 polyps  .  Colon polyps Sister   . Colon polyps Brother        had colectomy due to polyp count  . Colon polyps Son 85       total colectomy  . Familial polyposis Son        APC mutation  . Colon cancer Neg Hx     Social History        Tobacco Use  . Smoking status: Former Smoker    Types: Cigarettes    Quit date: 12/05/2017    Years since quitting: 3.0  . Smokeless  tobacco: Never Used  Vaping Use  . Vaping Use: Never used  Substance Use Topics  . Alcohol use: Yes    Comment: occ  . Drug use: Never          Current Outpatient Medications  Medication Sig Dispense Refill  . ALPRAZolam (XANAX) 0.25 MG tablet Take 0.25 mg by mouth every 6 (six) hours as needed.    Marland Kitchen aspirin 81 MG chewable tablet Chew by mouth daily.    Marland Kitchen atorvastatin (LIPITOR) 80 MG tablet Take 80 mg by mouth daily.    . carvedilol (COREG) 6.25 MG tablet Take 6.25 mg by mouth 2 (two) times daily with a meal.    . celecoxib (CELEBREX) 200 MG capsule Take 200 mg by mouth 2 (two) times daily.    . cetirizine (ZYRTEC) 10 MG tablet Take 10 mg by mouth daily.    . clopidogrel (PLAVIX) 75 MG tablet TAKE ONE TABLET BY MOUTH ONCE DAILY 90 tablet 2  . glipiZIDE (GLUCOTROL XL) 5 MG 24 hr tablet Take 5 mg by mouth daily.    . isosorbide mononitrate (IMDUR) 60 MG 24 hr tablet Take 1 tablet (60 mg total) by mouth daily. 30 tablet 0  . metFORMIN (GLUCOPHAGE) 850 MG tablet Take 850 mg by mouth every morning.     Marland Kitchen omeprazole (PRILOSEC) 20 MG capsule Take 20 mg by mouth as needed.    . ranolazine (RANEXA) 1000 MG SR tablet TAKE ONE TABLET BY MOUTH TWICE DAILY 180 tablet 2  . tolterodine (DETROL LA) 4 MG 24 hr capsule Take 4 mg by mouth daily.    . nitroGLYCERIN (NITROSTAT) 0.4 MG SL tablet Place 1 tablet (0.4 mg total) under the tongue every 5 (five) minutes as needed for chest pain. (Patient not taking: Reported on 12/09/2020) 25 tablet 3  . spironolactone (ALDACTONE) 25 MG tablet TAKE ONE TABLET BY MOUTH EVERY DAY 90 tablet 0   No current facility-administered medications for this visit.        Allergies  Allergen Reactions  . Losartan Rash    Review of Systems:  neg     Physical Exam:    BP 110/76   Pulse 96   Ht 5\' 8"  (1.727 m)   Wt 212 lb 2 oz (96.2 kg)   BMI 32.25 kg/m     Filed Weights   12/09/20 1540  Weight: 212 lb 2 oz (96.2 kg)  Gen:  awake, alert, NAD HEENT: anicteric, no pallor CV: RRR, no mrg Pulm: CTA b/l Abd: soft, NT/ND, +BS throughout Ext: no c/c/e Neuro: nonfocal    Carmell Austria, MD

## 2021-01-11 NOTE — Discharge Instructions (Signed)
YOU HAD AN ENDOSCOPIC PROCEDURE TODAY: Refer to the procedure report and other information in the discharge instructions given to you for any specific questions about what was found during the examination. If this information does not answer your questions, please call [place] office at [phone] to clarify.  ° °YOU SHOULD EXPECT: Some feelings of bloating in the abdomen. Passage of more gas than usual. Walking can help get rid of the air that was put into your GI tract during the procedure and reduce the bloating. If you had a lower endoscopy (such as a colonoscopy or flexible sigmoidoscopy) you may notice spotting of blood in your stool or on the toilet paper. Some abdominal soreness may be present for a day or two, also. ° °DIET: Your first meal following the procedure should be a light meal and then it is ok to progress to your normal diet. A half-sandwich or bowl of soup is an example of a good first meal. Heavy or fried foods are harder to digest and may make you feel nauseous or bloated. Drink plenty of fluids but you should avoid alcoholic beverages for 24 hours. If you had a esophageal dilation, please see attached instructions for diet.   ° °ACTIVITY: Your care partner should take you home directly after the procedure. You should plan to take it easy, moving slowly for the rest of the day. You can resume normal activity the day after the procedure however YOU SHOULD NOT DRIVE, use power tools, machinery or perform tasks that involve climbing or major physical exertion for 24 hours (because of the sedation medicines used during the test).  ° °SYMPTOMS TO REPORT IMMEDIATELY: °A gastroenterologist can be reached at any hour. Please call [phone]  for any of the following symptoms:  °Following lower endoscopy (colonoscopy, flexible sigmoidoscopy) °Excessive amounts of blood in the stool  °Significant tenderness, worsening of abdominal pains  °Swelling of the abdomen that is new, acute  °Fever of 100° or  higher  °Following upper endoscopy (EGD, EUS, ERCP, esophageal dilation) °Vomiting of blood or coffee ground material  °New, significant abdominal pain  °New, significant chest pain or pain under the shoulder blades  °Painful or persistently difficult swallowing  °New shortness of breath  °Black, tarry-looking or red, bloody stools ° °FOLLOW UP:  °If any biopsies were taken you will be contacted by phone or by letter within the next 1-3 weeks. Call [phone]  if you have not heard about the biopsies in 3 weeks.  °Please also call with any specific questions about appointments or follow up tests. ° °

## 2021-01-12 ENCOUNTER — Encounter (HOSPITAL_COMMUNITY): Payer: Self-pay | Admitting: Gastroenterology

## 2021-01-12 ENCOUNTER — Encounter: Payer: Self-pay | Admitting: Gastroenterology

## 2021-01-12 LAB — SURGICAL PATHOLOGY

## 2021-01-12 NOTE — Progress Notes (Signed)
Attempted, unable to leave message/bt 

## 2021-01-14 ENCOUNTER — Encounter: Payer: Self-pay | Admitting: Gastroenterology

## 2021-01-15 IMAGING — DX DG CHEST 2V
2 series · 2 of 2 positions shown · non-contrast
Comparison: September 18, 2019

CLINICAL DATA: Chest pain since 3 a.m.

EXAM:
CHEST - 2 VIEW

[chest pa]
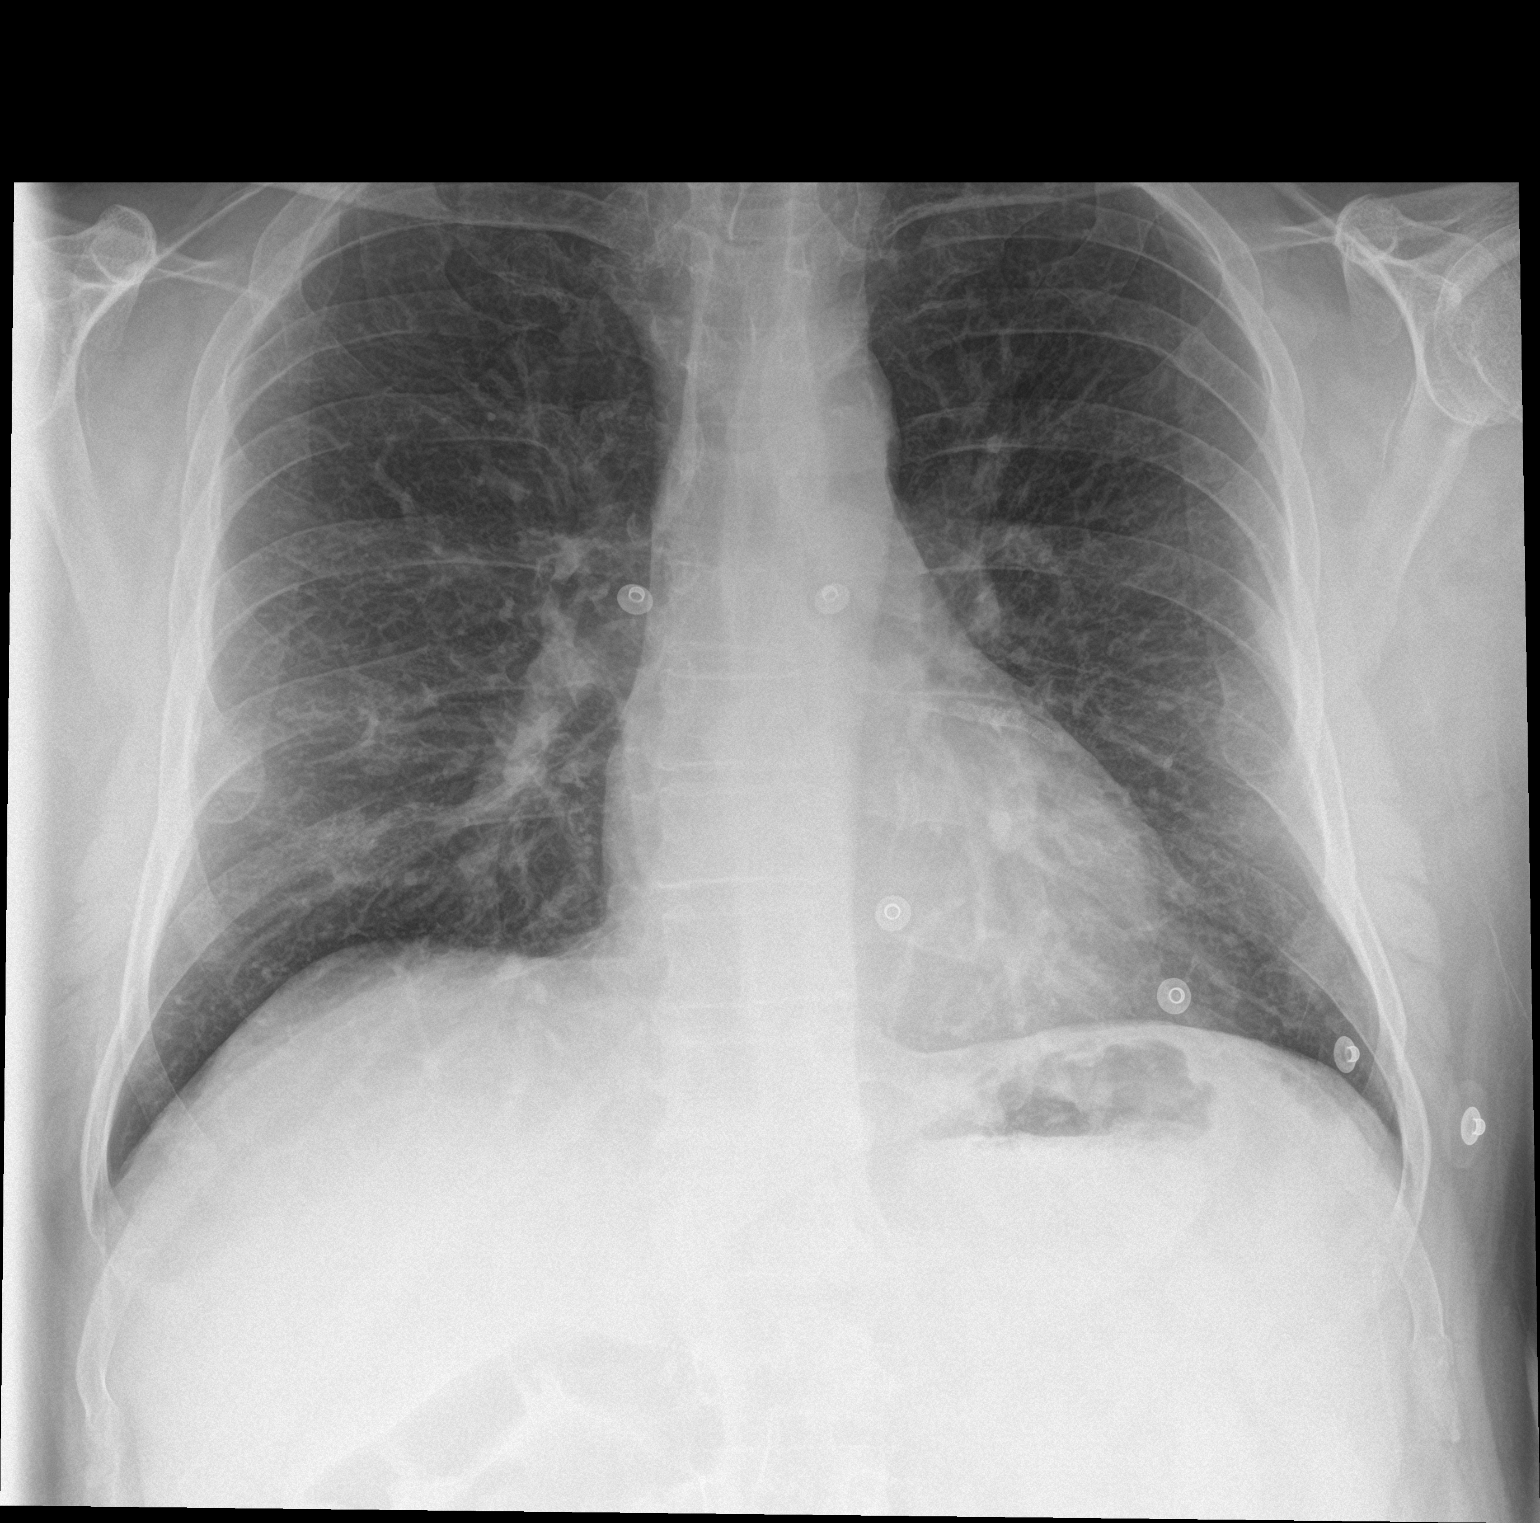

[chest lat]
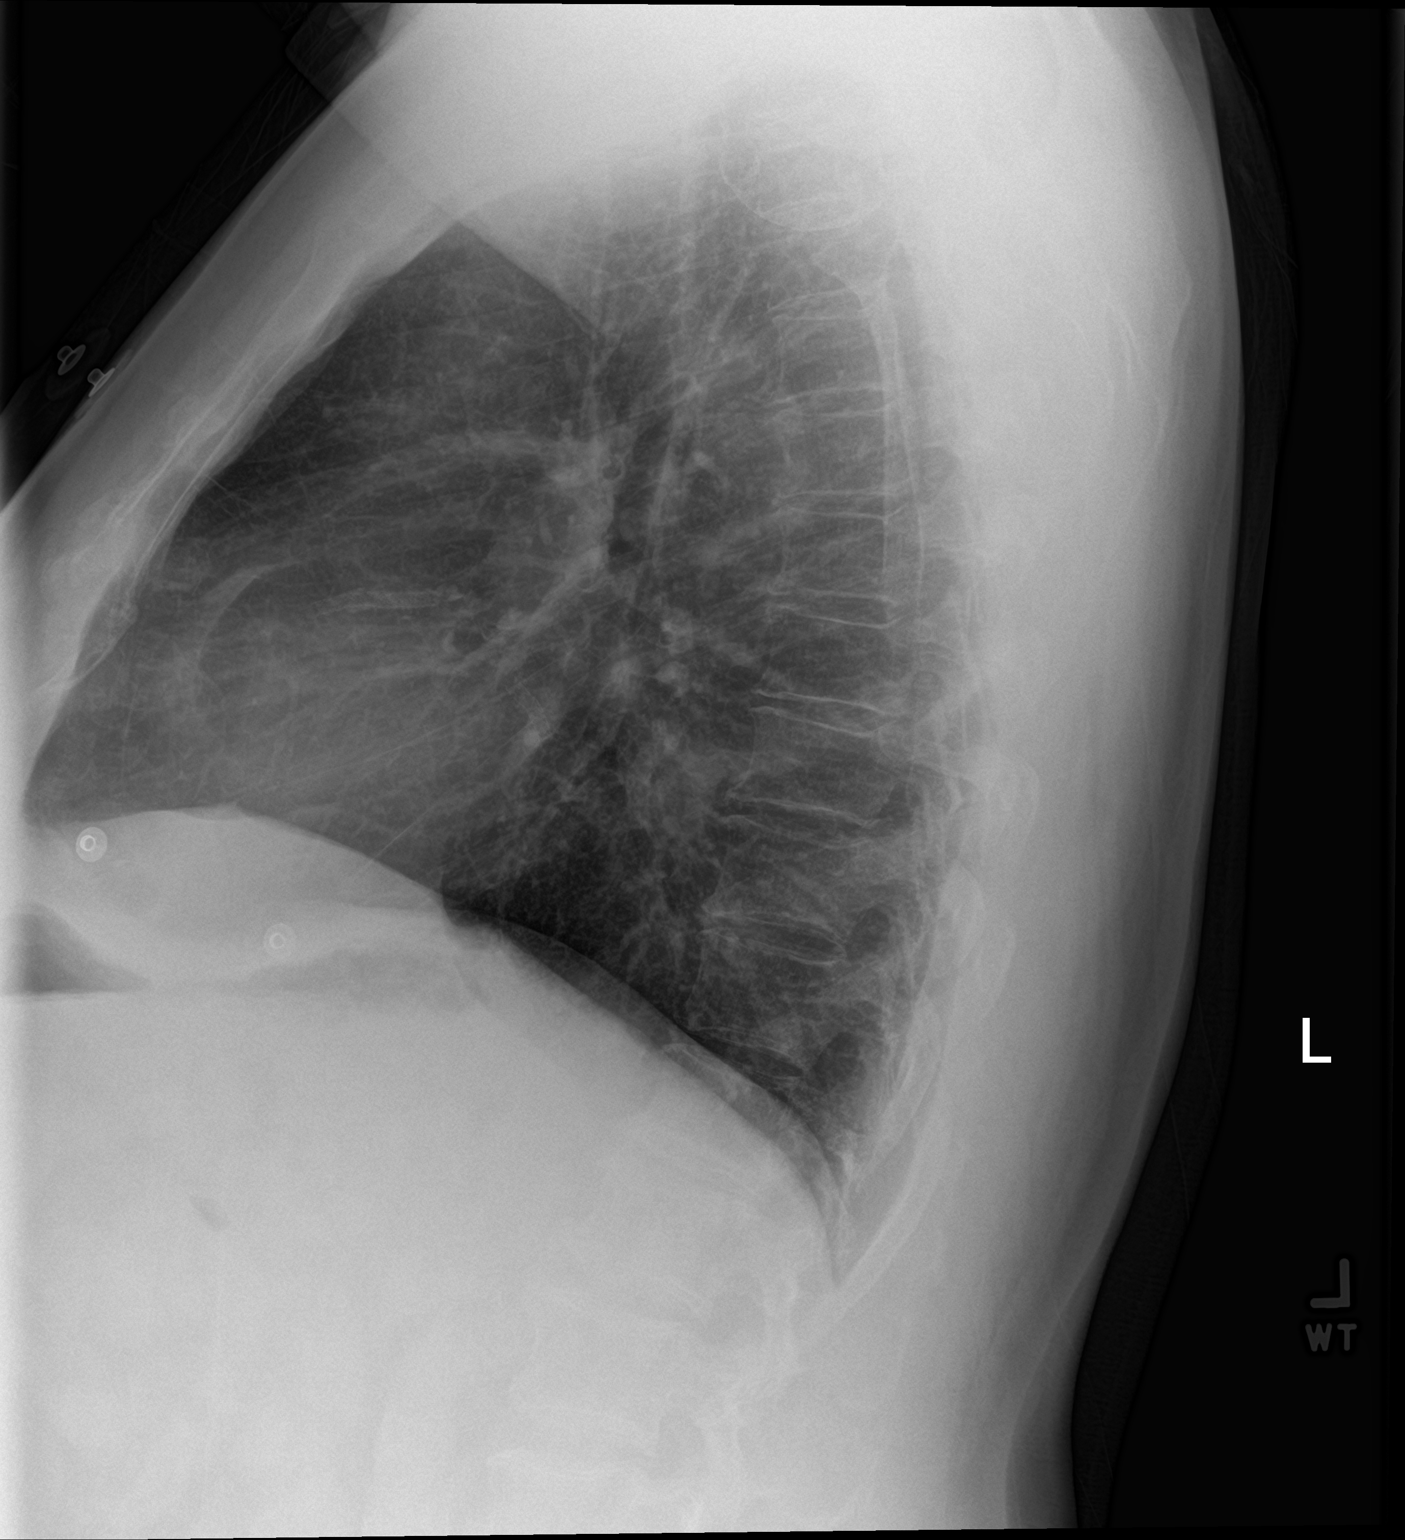

[2 of 2 positions shown; findings below may reference images not displayed]

FINDINGS: The heart size and mediastinal contours are within normal limits.
Both lungs are clear. The visualized skeletal structures are
unremarkable.
IMPRESSION: No active cardiopulmonary disease.

## 2021-01-20 ENCOUNTER — Ambulatory Visit (INDEPENDENT_AMBULATORY_CARE_PROVIDER_SITE_OTHER): Payer: Managed Care, Other (non HMO)

## 2021-01-20 ENCOUNTER — Telehealth: Payer: Self-pay

## 2021-01-20 ENCOUNTER — Other Ambulatory Visit: Payer: Self-pay

## 2021-01-20 DIAGNOSIS — I255 Ischemic cardiomyopathy: Secondary | ICD-10-CM | POA: Diagnosis not present

## 2021-01-20 LAB — ECHOCARDIOGRAM COMPLETE
Area-P 1/2: 3.27 cm2
Calc EF: 49.9 %
S' Lateral: 5.49 cm
Single Plane A2C EF: 52.8 %
Single Plane A4C EF: 48.6 %

## 2021-01-20 MED ORDER — VALSARTAN 40 MG PO TABS: 40.0000 mg | ORAL_TABLET | Freq: Two times a day (BID) | ORAL | 0 refills | Status: DC

## 2021-01-20 MED ORDER — METOPROLOL SUCCINATE ER 25 MG PO TB24: 25.0000 mg | ORAL_TABLET | Freq: Every day | ORAL | 3 refills | Status: DC

## 2021-01-20 NOTE — Progress Notes (Signed)
Complete echocardiogram performed.  Jimmy Gayna Braddy RDCS, RVT  

## 2021-01-20 NOTE — Telephone Encounter (Signed)
-----   Message from Larry Priest, MD sent at 01/20/2021  2:08 PM EST ----- His ejection fraction is reduced  I advised to stop carvedilol start Toprol-XL 25 mg daily and valsartan 40 mg twice daily for 1 month.  Trend blood pressure at home let us know if his systolics are less than 648  If tolerated at the end of the month call us and I will transition him to [facility] 24/26 twice daily.

## 2021-01-20 NOTE — Telephone Encounter (Signed)
Spoke with patient regarding results and recommendation.  Patient verbalizes understanding and is agreeable to plan of care. Advised patient to call back with any issues or concerns.   Patients blood pressure yesterday was 122/80

## 2021-02-04 ENCOUNTER — Telehealth: Payer: Self-pay | Admitting: Cardiology

## 2021-02-04 NOTE — Telephone Encounter (Signed)
Pt c/o BP issue: STAT if pt c/o blurred vision, one-sided weakness or slurred speech  1. What are your last 5 BP readings? No readings available  2. Are you having any other symptoms (ex. Dizziness, headache, blurred vision, passed out)? No   3. What is your BP issue?   Patient states this morning he went to the ED due to extremely low BP. He states he is still currently admitted and Dr. Sabra Heck is advising that the patient discontinues Valsartan and goes back on Carvedilol due to his potassium being too high. Patient states Dr. Sabra Heck also advised that the patient schedule an appointment for tomorrow, 02/04/21, with Dr. Bettina Gavia. I made him aware that Dr. Bettina Gavia does not have any availability for the next few weeks at both offices and he requested to be worked in. Can he be worked in anywhere? Please advise.

## 2021-02-04 NOTE — Telephone Encounter (Signed)
Spoke with the patient who states this morning while he was at work he became dizzy and lightheaded. He took his blood pressure and it was 100/60. He said he also felt some tingling in his right arm and was concerned about a heart attack so he took a nitroglycerin. After taking nitroglycerin he states that he almost blacked out. He retook blood pressure and it was 70/54. His son took him to [facility] where he is currently in the ER. He states that Dr. Sabra Heck is there and is advising that he stop taking valsartan and start taking carvedilol again. He also states that his potassium was too high. He states that the highest his BP has come back up to is 92/60. Advised patient to stay at the ER and take recommendations from Dr. Sabra Heck. Advised that I would let Dr. Bettina Gavia and his nurse know and they will follow up with further recommendations once he is discharged.

## 2021-02-05 DIAGNOSIS — I2510 Atherosclerotic heart disease of native coronary artery without angina pectoris: Secondary | ICD-10-CM | POA: Diagnosis not present

## 2021-02-05 DIAGNOSIS — I10 Essential (primary) hypertension: Secondary | ICD-10-CM | POA: Diagnosis not present

## 2021-02-05 DIAGNOSIS — E785 Hyperlipidemia, unspecified: Secondary | ICD-10-CM

## 2021-02-05 DIAGNOSIS — I951 Orthostatic hypotension: Secondary | ICD-10-CM | POA: Diagnosis not present

## 2021-02-05 DIAGNOSIS — E119 Type 2 diabetes mellitus without complications: Secondary | ICD-10-CM | POA: Diagnosis not present

## 2021-02-08 NOTE — Telephone Encounter (Signed)
Yes next week

## 2021-02-08 NOTE — Telephone Encounter (Signed)
Left message on patients voicemail to please return our call.   

## 2021-02-08 NOTE — Telephone Encounter (Signed)
Patient is following up, requesting to be worked in for a hospital follow-up with Dr. Bettina Gavia within the next week.

## 2021-02-10 NOTE — Telephone Encounter (Signed)
Patient calling back.   °

## 2021-02-10 NOTE — Telephone Encounter (Signed)
Spoke to the patient just now and got him scheduled for next week per Dr. Bettina Gavia. Patient was willing to come to [place] to see him.    Encouraged patient to call back with any questions or concerns.

## 2021-02-11 DIAGNOSIS — Z973 Presence of spectacles and contact lenses: Secondary | ICD-10-CM | POA: Insufficient documentation

## 2021-02-17 NOTE — Progress Notes (Signed)
Cardiology Office Note:    Date:  02/18/2021   ID:  Larry Flavors., DOB [birth year], MRN [id number]  PCP:  [name], MD  Cardiologist:  [name], MD    Referring MD: [name], MD    ASSESSMENT:    1. Hypotension due to drugs   2. Hyperkalemia   3. Ischemic cardiomyopathy   4. Coronary artery disease involving native coronary artery of native heart without angina pectoris   5. Essential hypertension    PLAN:    In order of problems listed above:  1. His hypotension was due to a combination of medications he remains on beta-blocker but that is better metoprolol and carvedilol very small dose of ARB with his LV dysfunction and no longer an MRA with hyperkalemia.  I will recheck his BMP today.  His CAD is stable I will plan to see him in 6 months repeat an echocardiogram at that time and I will ask him to continue to monitor his home blood pressure.   Next appointment: 6 months   Medication Adjustments/Labs and Tests Ordered: Current medicines are reviewed at length with the patient today.  Concerns regarding medicines are outlined above.  No orders of the defined types were placed in this encounter.  No orders of the defined types were placed in this encounter.   Chief Complaint  Patient presents with  . Follow-up  . Hypotension    History of Present Illness:    Larry Stangelo. is a 65 y.o. male with a hx of CAD history of anterior MI with PCI and drug-eluting stent to the LAD and left circumflex coronary arteries 01/01/2018 LV dysfunction EF 45 to 50% June 2020 hypertension hyperlipidemia and type 2 diabetes. He presented to [facility] ED 09/10/2019 and was transferred to [facility] for cardiac catheterization. Stents in the proximal LAD and mid left circumflex were patent and there was occlusion of the left PDA with collaterals and a small vessel felt to be too small for PCI ejection fraction was 40% at the time  He waslast seen  12/24/2020. Compliance with diet, lifestyle and medications: Yes  Since discharge he feels well blood pressure runs 1 76-7 20 systolic and tolerates his current medications off MRA of carvedilol and metoprolol and small dose ARB.  He is having no allergic symptoms.  No chest pain shortness of breath palpitation or syncope.  I agree that he does not require repeat ischemia evaluation.  Left heart catheterization 09/13/2019:  Previously placed Prox LAD to Mid LAD stent (unknown type) is widely patent.  Previously placed Prox Cx to Dist Cx stent (unknown type) is widely patent.  Ramus lesion is 100% stenosed.  LPAV lesion is 100% stenosed with 100% stenosed side branch in LPDA.  There is moderate left ventricular systolic dysfunction.  LV end diastolic pressure is normal.  The left ventricular ejection fraction is 35-45% by visual estimate.   1. CAD. Left dominant circulation    - Widely patent stent in the proximal LAD    - Widely patent stent in the mid LCx    - a tiny, wispy intermediate branch is occluded    - 100% proximal left PDA. There are collaterals to this vessel. The vessel is small in caliber 2. Moderate LV dysfunction. EF estimated at 40%.  3. Normal LVEDP  Echocardiogram 01/20/2021 showed EF of 35 to 40% normal right ventricular function.  He was recently seen at [facility] 02/04/2021 feeling lightheaded and had some  paresthesia in the right arm he took nitroglycerin and subsequently had a systolic blood pressure 70 presented to [facility] ED.  He was admitted to the hospital serial troponins were normal proBNP level quite low at 94 his sodium was 136 potassium 5.8 on admission to the hospital magnesium was low 1.50 CBC was normal.  He was seen in consultation by my partner he felt the problem was orthostatic hypotension with his elevated potassium he felt that his ARB should be decreased MRI discontinued and advised outpatient follow-up. Past Medical History:   Diagnosis Date  . Abnormal nuclear cardiac imaging test   . CAD (coronary artery disease) 02/28/2018  . 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 Dizziness 03/14/2018  . Essential hypertension 01/01/2018  . Familial adenomatous polyposis gene positive 12/28/2018   APC Q.683+419Q>Q  . Family history of colonic polyps   . Family history of FAP (familial adenomatous polyposis)   . 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  The report date is 12/28/2018.  Marland Kitchen GERD (gastroesophageal reflux disease)   . Heart attack ([place])   . History of colon polyps 12/17/2018  . Hyperglycemia 01/01/2018  . Hyperlipidemia   . Jaundice of recent onset 03/14/2018  . Left ventricular dysfunction 05/10/2019  . Nicotine abuse 01/01/2018  . Plantar fasciitis 07/13/2017  . ST elevation myocardial infarction involving left anterior descending (LAD) coronary artery ([place]) 01/01/2018   Formatting of this note might be different from the original. Added automatically from request for surgery [id number]  . Type 2 diabetes mellitus with circulatory disorder ([place]) 01/30/2018  . Unstable angina ([place]) 09/13/2019  . Wears glasses     Past Surgical History:  Procedure Laterality Date  . 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 BIOPSY  01/11/2021   Procedure: BIOPSY;  Surgeon: Jackquline Denmark, MD;  Location: [facility];  Service: Endoscopy;;  . ESOPHAGOGASTRODUODENOSCOPY  01/04/2017   Mild gastrtis. Otherwise normal EGD with convetional scope and ERCP scope. No periampullary tumors.   . ESOPHAGOGASTRODUODENOSCOPY (EGD) WITH PROPOFOL N/A 01/11/2021   Procedure: ESOPHAGOGASTRODUODENOSCOPY (EGD) WITH PROPOFOL;  Surgeon: Jackquline Denmark, MD;  [facility];  Service: Endoscopy;  Laterality: N/A;  w/ convientional and ERCP scope   . FLEXIBLE SIGMOIDOSCOPY N/A 01/11/2021   Procedure: FLEXIBLE SIGMOIDOSCOPY;   Surgeon: Jackquline Denmark, MD;  [facility];  Service: Endoscopy;  Laterality: N/A;  . Laprascopic assisted subtotal colectomy    . LEFT HEART CATH AND CORONARY ANGIOGRAPHY N/A 09/13/2019   Procedure: LEFT HEART CATH AND CORONARY ANGIOGRAPHY;  Surgeon: Martinique, Peter M, MD;  Location: [facility];  Service: Cardiovascular;  Laterality: N/A;  . WRIST SURGERY      Current Medications: Current Meds  Medication Sig  . ALPRAZolam (XANAX) 0.25 MG tablet Take 0.25 mg by mouth 2 (two) times daily as needed for anxiety.  Marland Kitchen aspirin 81 MG chewable tablet Chew 81 mg by mouth daily.  Marland Kitchen atorvastatin (LIPITOR) 80 MG tablet Take 80 mg by mouth every evening.  . celecoxib (CELEBREX) 200 MG capsule Take 200 mg by mouth 2 (two) times daily.  . cetirizine (ZYRTEC) 10 MG tablet Take 10 mg by mouth 2 (two) times daily.  . clopidogrel (PLAVIX) 75 MG tablet TAKE ONE TABLET BY MOUTH ONCE DAILY (Patient taking differently: Take 75 mg by mouth once.)  . Dutasteride-Tamsulosin HCl 0.5-0.4 MG CAPS Take 1 capsule by mouth daily.  Marland Kitchen  glimepiride (AMARYL) 4 MG tablet Take 4 mg by mouth 2 (two) times daily.  . metFORMIN (GLUCOPHAGE) 850 MG tablet Take 850 mg by mouth every evening.  . metoprolol succinate (TOPROL XL) 25 MG 24 hr tablet Take 1 tablet (25 mg total) by mouth daily.  . nitroGLYCERIN (NITROSTAT) 0.4 MG SL tablet Place 1 tablet (0.4 mg total) under the tongue every 5 (five) minutes as needed for chest pain.  Marland Kitchen omeprazole (PRILOSEC) 20 MG capsule Take 20 mg by mouth daily.  . ranolazine (RANEXA) 1000 MG SR tablet TAKE ONE TABLET BY MOUTH TWICE DAILY (Patient taking differently: Take 1,000 mg by mouth 2 (two) times daily.)  . tolterodine (DETROL LA) 4 MG 24 hr capsule Take 4 mg by mouth daily.  . valsartan (DIOVAN) 40 MG tablet Take 1 tablet (40 mg total) by mouth 2 (two) times daily.     Allergies:   Spironolactone and Angiotensin receptor blockers   Social History   Socioeconomic History  .  Marital status: Married    Spouse name: Not on file  . Number of children: Not on file  . Years of education: Not on file  . Highest education level: Not on file  Occupational History  . Not on file  Tobacco Use  . Smoking status: Former Smoker    Types: Cigarettes    Quit date: 12/05/2017    Years since quitting: 3.2  . Smokeless tobacco: Never Used  Vaping Use  . Vaping Use: Never used  Substance and Sexual Activity  . Alcohol use: Yes    Comment: occ  . Drug use: Never  . Sexual activity: Not on file  Other Topics Concern  . Not on file  Social History Narrative  . Not on file   Social Determinants of Health   Financial Resource Strain: Not on file  Food Insecurity: Not on file  Transportation Needs: Not on file  Physical Activity: Not on file  Stress: Not on file  Social Connections: Not on file     Family History: The patient's family history includes Colon polyps in his brother, mother, and sister; Colon polyps (age of onset: 89) in his son; Familial polyposis in his son. There is no history of Colon cancer. ROS:   Please see the history of present illness.    All other systems reviewed and are negative.  EKGs/Labs/Other Studies Reviewed:    The following studies were reviewed today:    Physical Exam:    VS:  BP 110/70   Pulse 66   Ht 5\' 8"  (1.727 m)   Wt 208 lb (94.3 kg)   SpO2 97%   BMI 31.63 kg/m     Wt Readings from Last 3 Encounters:  02/18/21 208 lb (94.3 kg)  01/11/21 199 lb 15.3 oz (90.7 kg)  12/24/20 209 lb 12.8 oz (95.2 kg)     GEN:  Well nourished, well developed in no acute distress HEENT: Normal NECK: No JVD; No carotid bruits LYMPHATICS: No lymphadenopathy CARDIAC: RRR, no murmurs, rubs, gallops RESPIRATORY:  Clear to auscultation without rales, wheezing or rhonchi  ABDOMEN: Soft, non-tender, non-distended MUSCULOSKELETAL:  No edema; No deformity  SKIN: Warm and dry NEUROLOGIC:  Alert and oriented x 3 PSYCHIATRIC:  Normal  affect    Signed, [name], MD  02/18/2021 9:19 AM    [place]

## 2021-02-18 ENCOUNTER — Ambulatory Visit (INDEPENDENT_AMBULATORY_CARE_PROVIDER_SITE_OTHER): Payer: Managed Care, Other (non HMO) | Admitting: Cardiology

## 2021-02-18 ENCOUNTER — Encounter: Payer: Self-pay | Admitting: Cardiology

## 2021-02-18 ENCOUNTER — Other Ambulatory Visit: Payer: Self-pay

## 2021-02-18 VITALS — BP 110/70 | HR 66 | Ht 68.0 in | Wt 208.0 lb

## 2021-02-18 DIAGNOSIS — E875 Hyperkalemia: Secondary | ICD-10-CM

## 2021-02-18 DIAGNOSIS — I10 Essential (primary) hypertension: Secondary | ICD-10-CM

## 2021-02-18 DIAGNOSIS — I2510 Atherosclerotic heart disease of native coronary artery without angina pectoris: Secondary | ICD-10-CM | POA: Diagnosis not present

## 2021-02-18 DIAGNOSIS — I255 Ischemic cardiomyopathy: Secondary | ICD-10-CM

## 2021-02-18 DIAGNOSIS — I952 Hypotension due to drugs: Secondary | ICD-10-CM | POA: Diagnosis not present

## 2021-02-18 LAB — BASIC METABOLIC PANEL
BUN/Creatinine Ratio: 17 (ref 10–24)
BUN: 16 mg/dL (ref 8–27)
CO2: 21 mmol/L (ref 20–29)
Calcium: 9.1 mg/dL (ref 8.6–10.2)
Chloride: 102 mmol/L (ref 96–106)
Creatinine, Ser: 0.96 mg/dL (ref 0.76–1.27)
Glucose: 137 mg/dL — ABNORMAL HIGH (ref 65–99)
Potassium: 4.7 mmol/L (ref 3.5–5.2)
Sodium: 139 mmol/L (ref 134–144)
eGFR: 89 mL/min/{1.73_m2} (ref >59)

## 2021-02-18 NOTE — Patient Instructions (Signed)
Medication Instructions:  Your physician recommends that you continue on your current medications as directed. Please refer to the Current Medication list given to you today.  *If you need a refill on your cardiac medications before your next appointment, please call your pharmacy*   Lab Work: Your physician recommends that you return for lab work in: TODAY BMP If you have labs (blood work) drawn today and your tests are completely normal, you will receive your results only by: MyChart Message (if you have MyChart) OR A paper copy in the mail If you have any lab test that is abnormal or we need to change your treatment, we will call you to review the results.   Testing/Procedures: None   Follow-Up: At [facility], you and your health needs are our priority.  As part of our continuing mission to provide you with exceptional heart care, we have created designated Provider Care Teams.  These Care Teams include your primary Cardiologist (physician) and Advanced Practice Providers (APPs -  Physician Assistants and Nurse Practitioners) who all work together to provide you with the care you need, when you need it.  We recommend signing up for the patient portal called "MyChart".  Sign up information is provided on this After Visit Summary.  MyChart is used to connect with patients for Virtual Visits (Telemedicine).  Patients are able to view lab/test results, encounter notes, upcoming appointments, etc.  Non-urgent messages can be sent to your provider as well.   To learn more about what you can do with MyChart, go to [email or web address].    Your next appointment:   6 month(s)  The format for your next appointment:   In Person  Provider:   Brian Munley, MD   Other Instructions   

## 2021-02-19 ENCOUNTER — Telehealth: Payer: Self-pay

## 2021-02-19 NOTE — Telephone Encounter (Signed)
-----   Message from [name], MD sent at 02/19/2021  7:49 AM EDT ----- Normal renal function no changes potassium is normal.

## 2021-02-19 NOTE — Telephone Encounter (Signed)
Patient notified of results.

## 2021-03-04 ENCOUNTER — Ambulatory Visit: Payer: Managed Care, Other (non HMO) | Admitting: Cardiology

## 2021-04-09 ENCOUNTER — Other Ambulatory Visit: Payer: Self-pay | Admitting: Cardiology

## 2021-05-17 ENCOUNTER — Other Ambulatory Visit: Payer: Self-pay | Admitting: Cardiology

## 2021-05-17 NOTE — Telephone Encounter (Signed)
Ranolazine 1000 mg SR tablet # 180 x 2 refills sent to pharmacy

## 2021-08-17 ENCOUNTER — Ambulatory Visit (INDEPENDENT_AMBULATORY_CARE_PROVIDER_SITE_OTHER): Payer: Managed Care, Other (non HMO) | Admitting: Cardiology

## 2021-08-17 ENCOUNTER — Other Ambulatory Visit: Payer: Self-pay

## 2021-08-17 ENCOUNTER — Encounter: Payer: Self-pay | Admitting: Cardiology

## 2021-08-17 VITALS — BP 122/84 | HR 80 | Ht 68.0 in | Wt 194.8 lb

## 2021-08-17 DIAGNOSIS — I10 Essential (primary) hypertension: Secondary | ICD-10-CM | POA: Diagnosis not present

## 2021-08-17 DIAGNOSIS — E782 Mixed hyperlipidemia: Secondary | ICD-10-CM

## 2021-08-17 DIAGNOSIS — I2510 Atherosclerotic heart disease of native coronary artery without angina pectoris: Secondary | ICD-10-CM

## 2021-08-17 DIAGNOSIS — R42 Dizziness and giddiness: Secondary | ICD-10-CM

## 2021-08-17 DIAGNOSIS — I952 Hypotension due to drugs: Secondary | ICD-10-CM | POA: Diagnosis not present

## 2021-08-17 MED ORDER — RANOLAZINE ER 500 MG PO TB12: 500.0000 mg | ORAL_TABLET | Freq: Two times a day (BID) | ORAL | 3 refills | Status: DC

## 2021-08-17 NOTE — Patient Instructions (Signed)
Medication Instructions: Your physician has recommended you make the following change in your medication:  DECREASE: Ranexa 500 mg take one tablet by mouth twice daily.  *If you need a refill on your cardiac medications before your next appointment, please call your pharmacy*   Lab Work: None If you have labs (blood work) drawn today and your tests are completely normal, you will receive your results only by: [place] (if you have MyChart) OR A paper copy in the mail If you have any lab test that is abnormal or we need to change your treatment, we will call you to review the results.   Testing/Procedures: None   Follow-Up: At [facility], you and your health needs are our priority.  As part of our continuing mission to provide you with exceptional heart care, we have created designated Provider Care Teams.  These Care Teams include your primary Cardiologist (physician) and Advanced Practice Providers (APPs -  Physician Assistants and Nurse Practitioners) who all work together to provide you with the care you need, when you need it.  We recommend signing up for the patient portal called "MyChart".  Sign up information is provided on this After Visit Summary.  MyChart is used to connect with patients for Virtual Visits (Telemedicine).  Patients are able to view lab/test results, encounter notes, upcoming appointments, etc.  Non-urgent messages can be sent to your provider as well.   To learn more about what you can do with MyChart, go to [email or web address].    Your next appointment:   6 month(s)  The format for your next appointment:   In Person  Provider:   [name], MD   Other Instructions

## 2021-08-17 NOTE — Progress Notes (Signed)
Cardiology Office Note:    Date:  08/17/2021   ID:  Larry Flavors., DOB 1956-10-17, MRN [id number]  PCP:  [name], MD  Cardiologist:  [name], MD    Referring MD: [name], MD    ASSESSMENT:    1. Hypotension due to drugs   2. Coronary artery disease involving native coronary artery of native heart without angina pectoris   3. Essential hypertension   4. Mixed hyperlipidemia   5. Dizzy    PLAN:    In order of problems listed above:  Resolved at this time is not on ARB or Entresto and we will reassess at next visit Stable CAD continue medical therapy with long-term dual antiplatelet along with ranolazine reduced dose and his high intensity statin BP at target on current treatment continue same Statin lipids at target continue atorvastatin Likely related to central effect on noticing decreased dosage   Next appointment: 6 months   Medication Adjustments/Labs and Tests Ordered: Current medicines are reviewed at length with the patient today.  Concerns regarding medicines are outlined above.  No orders of the defined types were placed in this encounter.  Meds ordered this encounter  Medications   ranolazine (RANEXA) 500 MG 12 hr tablet    Sig: Take 1 tablet (500 mg total) by mouth 2 (two) times daily.    Dispense:  180 tablet    Refill:  3    Chief Complaint  Patient presents with   Coronary Artery Disease   Hypertension   Hyperlipidemia   Diabetes Mellitus    History of Present Illness:    Larry Dios. is a 65 y.o. male with a hx of CAD with anterior MI and PCI and drug-eluting stent to LAD and left circumflex coronary arteries January 2019 LV dysfunction EF 45 to 50% hypertension hyperlipidemia type 2 diabetes last seen 02/18/2021 with hypotension related to medications.Marland KitchenHe presented to [place] [facility] 09/10/2019 and was transferred to [facility] for cardiac catheterization. Stents in the proximal LAD and mid left circumflex were  patent and there was occlusion of the left PDA with collaterals and a small vessel felt to be too small for PCI ejection fraction was 40% at the time   His echocardiogram 01/20/2021 showed continued LV dysfunction EF 35 to 40%. 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  The report date is 12/28/2018.   GERD (gastroesophageal reflux disease)  Heart attack ([place])    History of colon polyps 12/17/2018   Hyperglycemia 01/01/2018   Hyperlipidemia    Jaundice of recent onset 03/14/2018   Left ventricular dysfunction 05/10/2019   Nicotine abuse 01/01/2018   Plantar fasciitis 07/13/2017   ST elevation myocardial infarction involving left anterior descending (LAD) coronary artery ([place]) 01/01/2018   Formatting of this note might be different from the original. Added automatically from request for surgery  [id number]   Type 2 diabetes mellitus with circulatory disorder ([place]) 01/30/2018   Unstable angina ([place]) 09/13/2019   Wears glasses     Past Surgical History:  Procedure Laterality Date   Attenuated FAP s/p laproscopic assisted subtotal colectomy with ileo recatal anastomosis     BIOPSY  01/11/2021   Procedure: BIOPSY;  Surgeon: Jackquline Denmark, MD;  [facility];  Service: Endoscopy;;   ESOPHAGOGASTRODUODENOSCOPY  01/04/2017   Mild gastrtis. Otherwise normal EGD with convetional scope and ERCP scope. No periampullary tumors.    ESOPHAGOGASTRODUODENOSCOPY (EGD) WITH PROPOFOL N/A 01/11/2021   Procedure: ESOPHAGOGASTRODUODENOSCOPY (EGD) WITH PROPOFOL;  Surgeon: Jackquline Denmark, MD;  [facility];  Service: Endoscopy;  Laterality: N/A;  w/ convientional and ERCP scope    FLEXIBLE SIGMOIDOSCOPY N/A 01/11/2021   Procedure: FLEXIBLE SIGMOIDOSCOPY;  Surgeon: Jackquline Denmark, MD;  [facility];  Service: Endoscopy;  Laterality: N/A;   Laprascopic assisted subtotal colectomy     LEFT HEART CATH AND CORONARY ANGIOGRAPHY N/A 09/13/2019   Procedure: LEFT HEART CATH AND CORONARY ANGIOGRAPHY;  Surgeon: Martinique, Peter M, MD;  Location: [place] [place] [facility];  Service: Cardiovascular;  Laterality: N/A;   WRIST SURGERY      Current Medications: Current Meds  Medication Sig   ALPRAZolam (XANAX) 0.25 MG tablet Take 0.25 mg by mouth 2 (two) times daily as needed for anxiety.   aspirin 81 MG chewable tablet Chew 81 mg by mouth daily.   atorvastatin (LIPITOR) 80 MG tablet Take 80 mg by mouth every evening.   celecoxib (CELEBREX) 200 MG capsule Take 200 mg by mouth 2 (two) times daily.   cetirizine (ZYRTEC) 10 MG tablet Take 10 mg by mouth 2 (two) times daily.   clopidogrel (PLAVIX) 75 MG tablet TAKE ONE TABLET BY MOUTH EVERY DAY   glimepiride (AMARYL) 4 MG tablet Take 4 mg by mouth 2 (two) times daily.   metFORMIN (GLUCOPHAGE) 850 MG tablet Take 850 mg by mouth every evening.   nitroGLYCERIN  (NITROSTAT) 0.4 MG SL tablet Place 1 tablet (0.4 mg total) under the tongue every 5 (five) minutes as needed for chest pain.   omeprazole (PRILOSEC) 20 MG capsule Take 20 mg by mouth daily.   [DISCONTINUED] ranolazine (RANEXA) 1000 MG SR tablet TAKE ONE TABLET BY MOUTH TWICE DAILY     Allergies:   Spironolactone and Angiotensin receptor blockers   Social History   Socioeconomic History   Marital status: Married    Spouse name: Not on file   Number of children: Not on file   Years of education: Not on file   Highest education level: Not on file  Occupational History   Not on file  Tobacco Use   Smoking status: Former    Types: Cigarettes    Quit date: 12/05/2017    Years since quitting: 3.7   Smokeless tobacco: Never  Vaping Use   Vaping Use: Never used  Substance and Sexual Activity   Alcohol use: Yes    Comment: occ   Drug use: Never   Sexual activity: Not  on file  Other Topics Concern   Not on file  Social History Narrative   Not on file   Social Determinants of Health   Financial Resource Strain: Not on file  Food Insecurity: Not on file  Transportation Needs: Not on file  Physical Activity: Not on file  Stress: Not on file  Social Connections: Not on file     Family History: The patient's family history includes Colon polyps in his brother, mother, and sister; Colon polyps (age of onset: 74) in his son; Familial polyposis in his son. There is no history of Colon cancer. ROS:   Please see the history of present illness.    All other systems reviewed and are negative.  EKGs/Labs/Other Studies Reviewed:    The following studies were reviewed today:   Physical Exam:    VS:  BP 122/84 (BP Location: Right Arm, Patient Position: Sitting, Cuff Size: Normal)   Pulse 80   Ht '5\' 8"'$  (1.727 m)   Wt 194 lb 12.8 oz (88.4 kg)   SpO2 98%   BMI 29.62 kg/m     Wt Readings from Last 3 Encounters:  08/17/21 194 lb 12.8 oz (88.4 kg)  02/18/21 208 lb (94.3 kg)   01/11/21 199 lb 15.3 oz (90.7 kg)     GEN:  Well nourished, well developed in no acute distress HEENT: Normal NECK: No JVD; No carotid bruits LYMPHATICS: No lymphadenopathy CARDIAC: RRR, no murmurs, rubs, gallops RESPIRATORY:  Clear to auscultation without rales, wheezing or rhonchi  ABDOMEN: Soft, non-tender, non-distended MUSCULOSKELETAL:  No edema; No deformity  SKIN: Warm and dry NEUROLOGIC:  Alert and oriented x 3 PSYCHIATRIC:  Normal affect    Signed, [name], MD  08/17/2021 3:29 PM    [facility]

## 2022-01-17 IMAGING — US US ABDOMEN COMPLETE
1 series · 14 of 25 positions shown · non-contrast
Comparison: None.

CLINICAL DATA: Prior cholecystectomy for familial adenomatous
polyposis. Prior colectomy.

EXAM:
ABDOMEN ULTRASOUND COMPLETE

[Series 1: us abdomen complete · 14 of 71 slices shown]
[im 1/71]
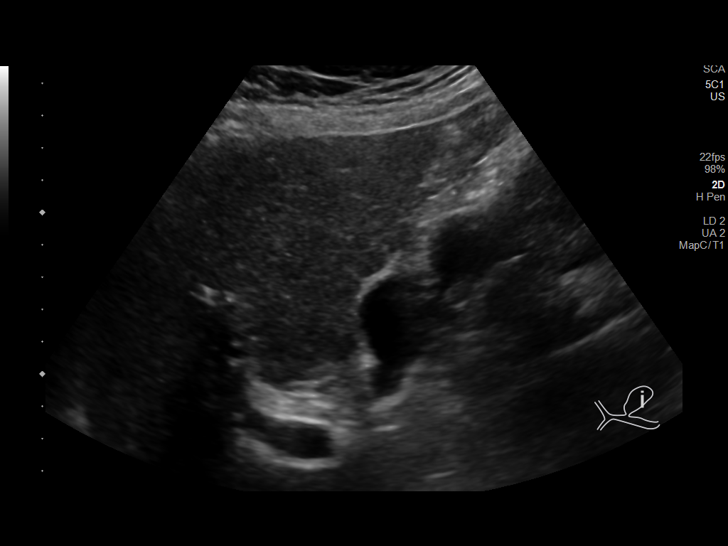
[im 6/71]
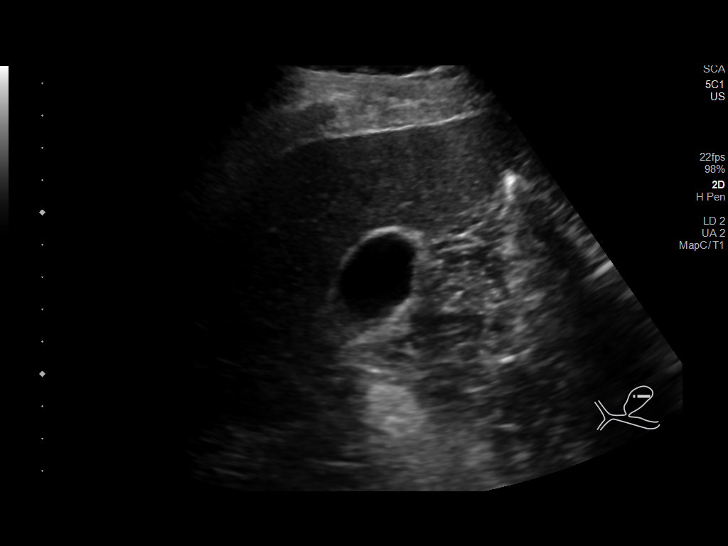
[im 12/71]
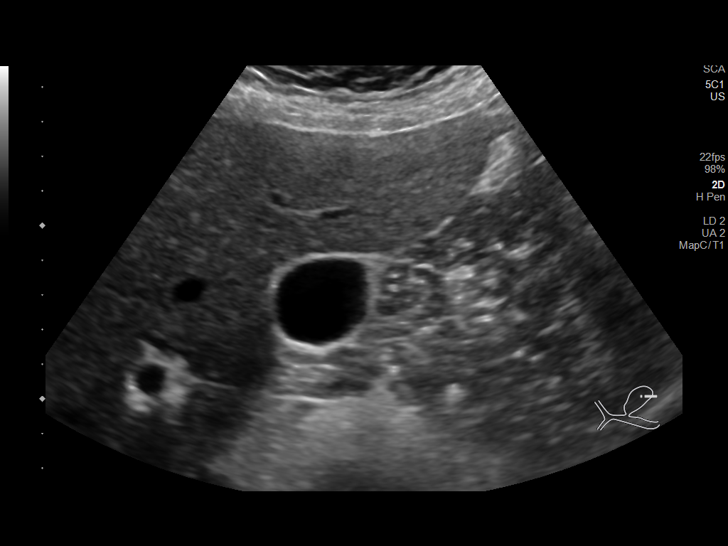
[im 18/71]
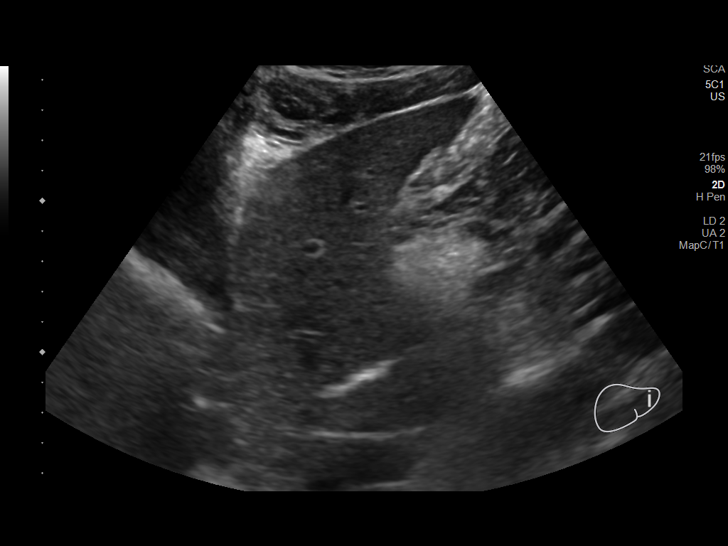
[im 24/71]
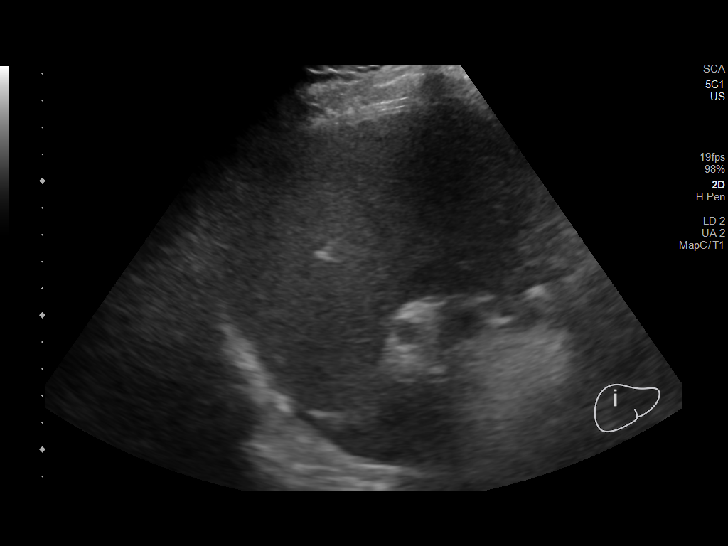
[im 27/71]
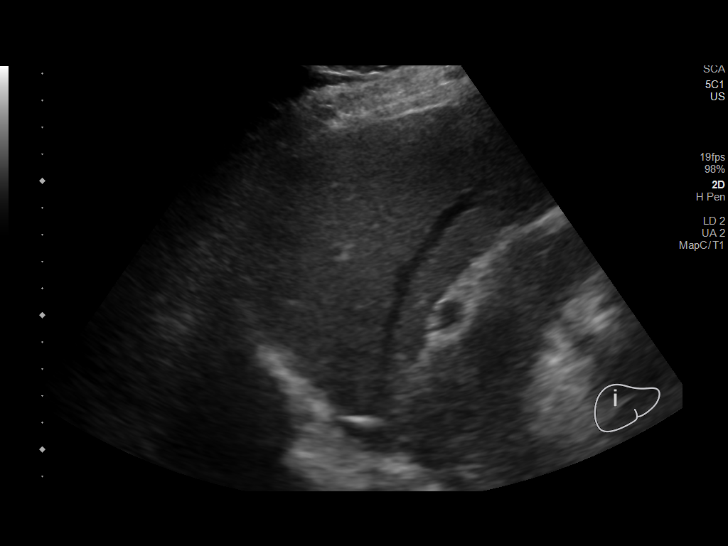
[im 33/71]
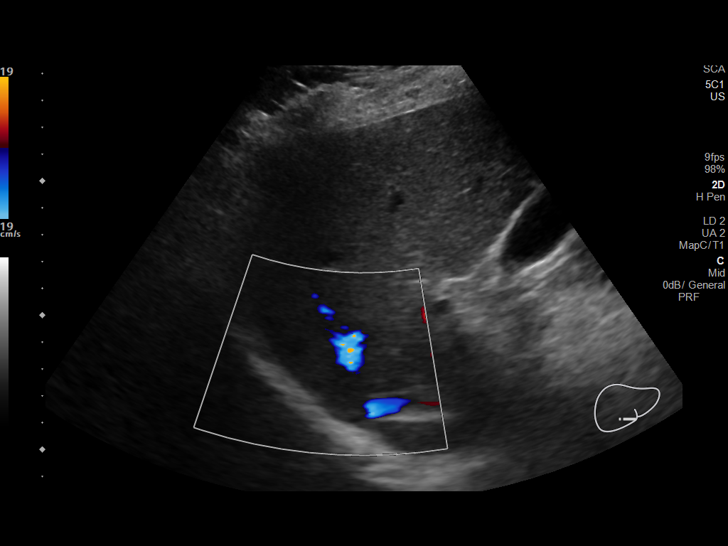
[im 38/71]
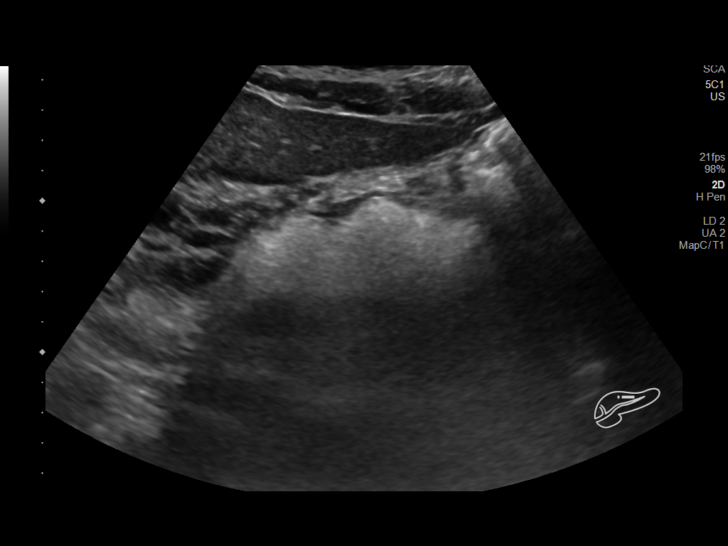
[im 44/71]
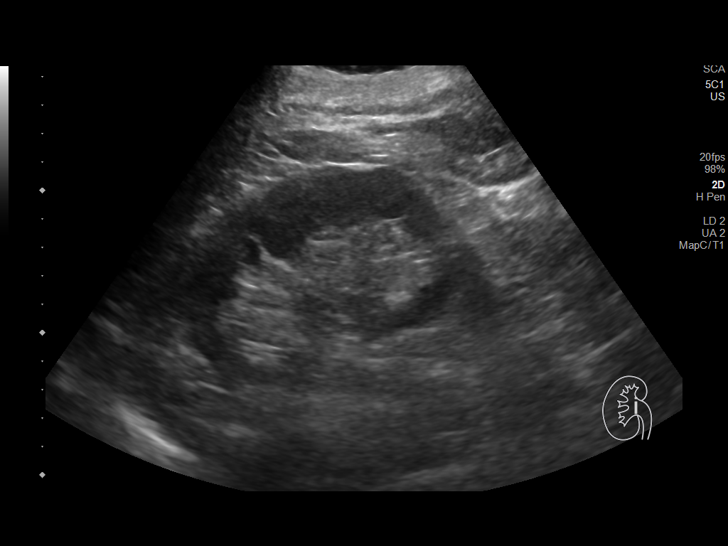
[im 47/71]
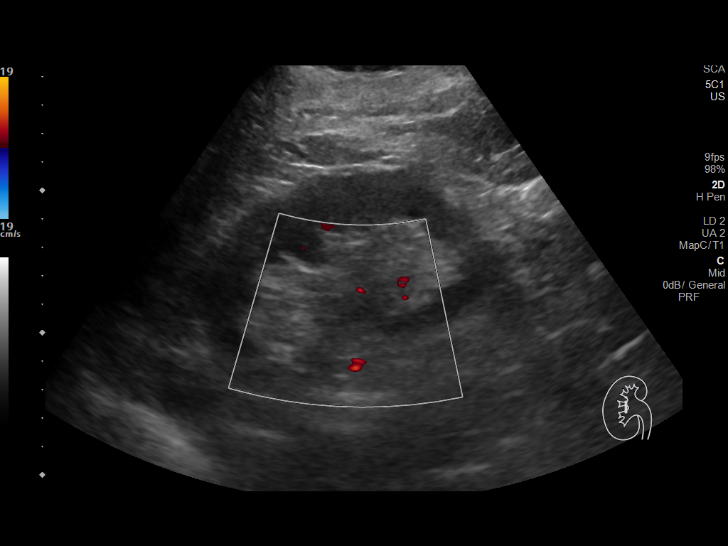
[im 53/71]
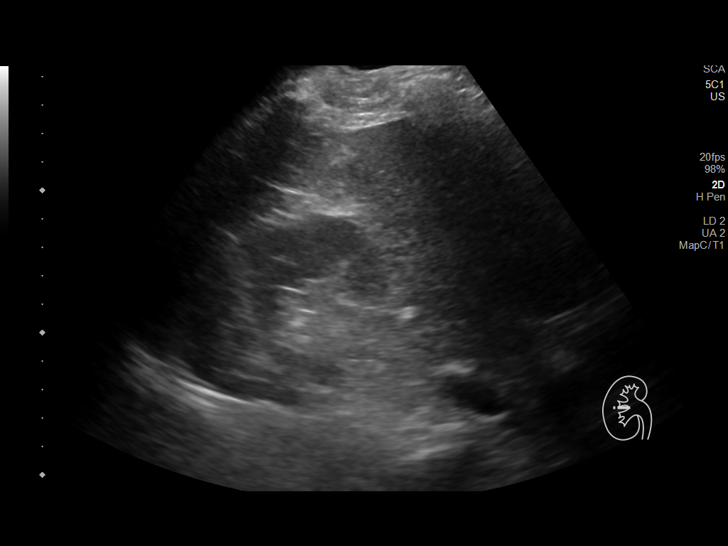
[im 59/71]
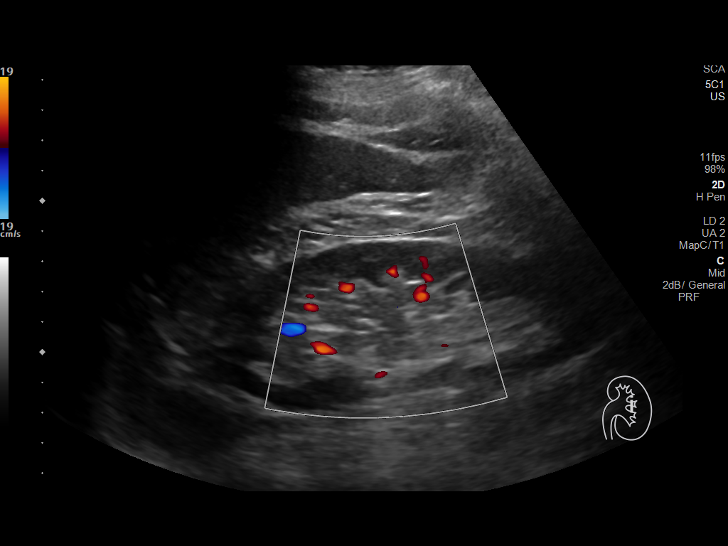
[im 65/71]
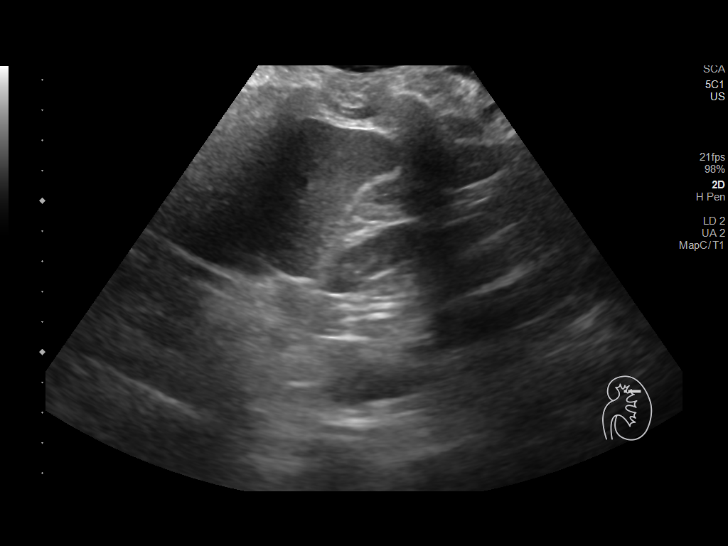
[im 71/71]
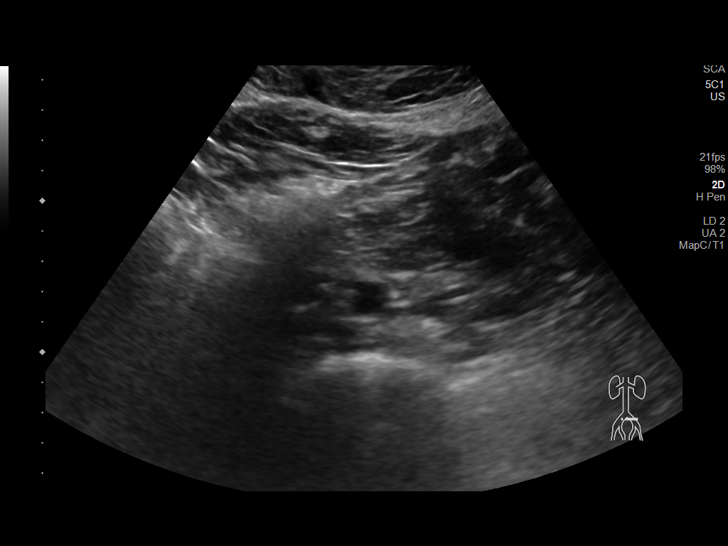

[14 of 25 positions shown; findings below may reference images not displayed]

FINDINGS: Gallbladder: No gallstones or wall thickening visualized. No
sonographic Murphy sign noted by sonographer.

Common bile duct: Diameter: 3 mm, within normal limits.

Liver: No focal lesion identified. Within normal limits in
parenchymal echogenicity. Portal vein is patent on color Doppler
imaging with normal direction of blood flow towards the liver.

IVC: No abnormality visualized.

Pancreas: Visualized portion unremarkable.

Spleen: Size and appearance within normal limits.

Right Kidney: Length: 10.9 cm. Echogenicity within normal limits. No
mass or hydronephrosis visualized.

Left Kidney: Length: 14.3 cm. Echogenicity within normal limits. No
mass or hydronephrosis visualized.

Abdominal aorta: No aneurysm visualized.

Other findings: None.
IMPRESSION: Negative abdomen ultrasound.

## 2022-01-22 ENCOUNTER — Other Ambulatory Visit: Payer: Self-pay | Admitting: Cardiology

## 2022-02-08 ENCOUNTER — Telehealth: Payer: Self-pay | Admitting: Gastroenterology

## 2022-02-08 NOTE — Telephone Encounter (Signed)
Pt questioning "when he should have another Colonoscopy":  ?Per Pt records: Pt had a flex sig done on 01/11/2021 ?Recommendations from Flex Sig as follows: ?- Discharge patient to home. ?- Use Preparation H on as-needed basis. ?- Resume Plavix (clopidogrel) at prior dose tomorrow. ?- The findings and recommendations were discussed with Randall Hiss. ?- Repeat flexible sigmoidoscopy in 1 year for screening purposes. ? ?Please advise if you would like Flex Sig ordered and if this is to be done at [facility] as before  or can be done in [place]  ?Pt is also on Plavix: ?Please advise  ?

## 2022-02-17 NOTE — Telephone Encounter (Signed)
He does not have much colon left due to previous surgery ?Has FAP ? ?Plan: ?-Proceed with flexible sigmoidoscopy (can do without sedation if he is willing) at [facility].  First available with me ? ?RG ?

## 2022-02-18 ENCOUNTER — Telehealth: Payer: Self-pay

## 2022-02-18 NOTE — Telephone Encounter (Signed)
Larry Warner. 66 year old male is requesting preoperative cardiac evaluation for flexible sigmoidoscopy.  He was last seen in the clinic on 08/17/2021.  He continues to do well from a cardiac standpoint at that time.  Follow-up was planned for 6 months. ? ? ?His PMH includes  CAD with anterior MI and PCI and drug-eluting stent to LAD and left circumflex coronary arteries January 2019 LV dysfunction EF 45 to 50% hypertension hyperlipidemia type 2 diabetes last seen 02/18/2021 with hypotension related to medications.Marland KitchenHe presented to [place] ED 09/10/2019 and was transferred to [facility] for cardiac catheterization. Stents in the proximal LAD and mid left circumflex were patent and there was occlusion of the left PDA with collaterals and a small vessel felt to be too small for PCI ejection fraction was 40% at the time   ?His echocardiogram 01/20/2021 showed continued LV dysfunction EF 35 to 40%. ? ? ?May his Plavix be held prior to his procedure? ? ?Thank you for your help.  Please direct your response to CV DIV preop pool. ? ?[name]. Faithann Natal NP-C ? ?  ?02/18/2022, 11:09 AM ?[place] ?[address] ?Office [phone] Fax [phone] ? ?

## 2022-02-18 NOTE — Telephone Encounter (Signed)
Pt has appt with Dr. Bettina Gavia 02/21/22, will add need pre op clearance.  ?

## 2022-02-18 NOTE — Telephone Encounter (Signed)
[facility] Pre-operative Risk Assessment  ?   ?[place] ?March 31, 1956 ?[id number] ? ?Procedure: Flexible Sigmoidoscopy ?Anesthesia type:  MAC ?Procedure Date: 04/22/2022 ?Provider: Dr. Jackquline Denmark ? ?Type of Clearance needed: Pharmacy/Cardiac/Medical ? ?Medication(s) needing held: Plavix  ? ?Length of time for medication to be held: 5 day ? ?Please review request and advise by either responding to this message or by sending your response to the fax # provided below. ? ?Thank you, ? ?[facility]  ?Phone: [phone] ?Fax: [phone] ?ATTENTION: AMMIE EVERSOLE, LPN ? ? ? ?

## 2022-02-18 NOTE — Telephone Encounter (Signed)
Pt notified of Dr. Lyndel Safe recommendations: ?Pt scheduled for 04/22/2022 at 10:00 AM with Dr. Lyndel Safe for a Flex Sig in the [place]: Pt earliest opportunity:  Pt made aware ?Pt scheduled for a previsit appointment on 03/25/2022 at 8:00: Telephone Visit: Pt made aware ?Medication clearance for plavix sent to cardiologist: Pt made aware ?Pt verbalized understanding with all questions answered.  ? ?

## 2022-02-18 NOTE — Telephone Encounter (Signed)
Preoperative team, please contact this patient and set up a phone call appointment for further cardiac evaluation.  Thank you for your help. ? ?[name]. [name] NP-C ? ?  ?02/18/2022, 1:04 PM ?Larry Warner ?[address] ?Office [phone] Fax [phone] ? ?

## 2022-02-20 NOTE — Progress Notes (Signed)
?Cardiology Office Note:   ? ?Date:  02/21/2022  ? ?ID:  Larry Flavors., DOB 22-May-1956, MRN [id number] ? ?PCP:  [name], MD  ?Cardiologist:  [name], MD   ? ?Referring MD: [name], MD  ? ? ?ASSESSMENT:   ? ?1. Coronary artery disease involving native coronary artery of native heart without angina pectoris   ?2. Ischemic cardiomyopathy   ?3. Essential hypertension   ?4. Mixed hyperlipidemia   ?5. Hypotension due to drugs   ? ?PLAN:   ? ?In order of problems listed above: ? ?Clinically is doing well having no anginal discomfort on current medical therapy he will withdrawal aspirin and clopidogrel 1 week prior to sigmoidoscopy resume clopidogrel only afterwards and continue his current lipid-lowering with atorvastatin.  He be given a new prescription for nitroglycerin.  Continue ranolazine ?Unfortunately intolerant of ACE ARB and Entresto with severe symptomatic hypotension recheck his ejection fraction ?3.  BP at target currently not on antihypertensive agents ?4.  Continue his high intensity statin ? ?Next appointment: 6 months ? ? ?Medication Adjustments/Labs and Tests Ordered: ?Current medicines are reviewed at length with the patient today.  Concerns regarding medicines are outlined above.  ?No orders of the defined types were placed in this encounter. ? ?Meds ordered this encounter  ?Medications  ? nitroGLYCERIN (NITROSTAT) 0.4 MG SL tablet  ?  Sig: Place 1 tablet (0.4 mg total) under the tongue every 5 (five) minutes as needed for chest pain.  ?  Dispense:  25 tablet  ?  Refill:  3  ? ? ?Chief Complaint  ?Patient presents with  ? Pre-op Exam  ?He is pending sigmoidoscopy ? ?History of Present Illness:   ? ?Larry Sciulli. is a 66 y.o. male with a hx of CAD with anterior MI and PCI and drug-eluting stent to LAD and left circumflex coronary arteries January 2019 LV dysfunction EF 45 to 50% hypertension hyperlipidemia type 2 diabetes last seen 02/18/2021 with hypotension related to  medications.Marland KitchenHe presented to [place] ED 09/10/2019 and was transferred to [facility] for cardiac catheterization. Stents in the proximal LAD and mid left circumflex were patent and there was occlusion of the left PDA with collaterals and a small vessel felt to be too small for PCI ejection fraction was 40% at the time   ? ?His echocardiogram 01/20/2021 showed continued LV dysfunction EF 35 to 40%.  He was last seen 08/17/2021. ? ?Compliance with diet, lifestyle and medications: yes ? ?From a cardiology perspective doing well he is having no exercise intolerance angina edema shortness of breath palpitation or syncope. ?He has had multiple colon polyps and is scheduled for endoscopic evaluation will need to come off his dual antiplatelet therapy ?He bruises easily and has bleeding at work and physical labor I would like to stop aspirin.  He will stop aspirin and clopidogrel 1 week prior to the procedure and resume only clopidogrel afterwards usually 48 hours but at the discretion of his GI physician. ?Past Medical History:  ?Diagnosis Date  ? Abnormal nuclear cardiac imaging test   ? CAD (coronary artery disease) 02/28/2018  ? 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 Dizziness 03/14/2018  ? Essential hypertension 01/01/2018  ? Familial adenomatous polyposis gene positive 12/28/2018  ? APC S.283+151V>O  ? Family history of colonic polyps   ? Family history of FAP (familial adenomatous polyposis)   ? Genetic testing 12/28/2018  ? APC H.607+371G>G Likely pathogenic variant identified on the specific site analysis  of the gene.  The report date is 12/28/2018.  ? GERD (gastroesophageal reflux disease)   ? Heart attack ([place])   ? History of colon polyps 12/17/2018  ? Hyperglycemia 01/01/2018  ? Hyperlipidemia   ? Jaundice of recent onset 03/14/2018  ? Left ventricular dysfunction 05/10/2019  ? Nicotine abuse 01/01/2018  ? Plantar fasciitis 07/13/2017  ? ST elevation myocardial infarction involving left anterior descending  (LAD) coronary artery ([place]) 01/01/2018  ? Formatting of this note might be different from the original. Added automatically from request for surgery [phone]  ? Type 2 diabetes mellitus with circulatory disorder ([place]) 01/30/2018  ? Unstable angina ([place]) 09/13/2019  ? Wears glasses   ? ? ?Past Surgical History:  ?Procedure Laterality Date  ? 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 BIOPSY  01/11/2021  ? Procedure: BIOPSY;  Surgeon: Jackquline Denmark, MD;  Location: [facility];  Service: Endoscopy;;  ? ESOPHAGOGASTRODUODENOSCOPY  01/04/2017  ? Mild gastrtis. Otherwise normal EGD with convetional scope and ERCP scope. No periampullary tumors.   ? ESOPHAGOGASTRODUODENOSCOPY (EGD) WITH PROPOFOL N/A 01/11/2021  ? Procedure: ESOPHAGOGASTRODUODENOSCOPY (EGD) WITH PROPOFOL;  Surgeon: Jackquline Denmark, MD;  Location: [facility];  Service: Endoscopy;  Laterality: N/A;  w/ convientional and ERCP scope   ? FLEXIBLE SIGMOIDOSCOPY N/A 01/11/2021  ? Procedure: FLEXIBLE SIGMOIDOSCOPY;  Surgeon: Jackquline Denmark, MD;  Location: [facility];  Service: Endoscopy;  Laterality: N/A;  ? Laprascopic assisted subtotal colectomy    ? LEFT HEART CATH AND CORONARY ANGIOGRAPHY N/A 09/13/2019  ? Procedure: LEFT HEART CATH AND CORONARY ANGIOGRAPHY;  Surgeon: Martinique, Peter M, MD;  Location: [facility];  Service: Cardiovascular;  Laterality: N/A;  ? WRIST SURGERY    ? ? ?Current Medications: ?Current Meds  ?Medication Sig  ? ALPRAZolam (XANAX) 0.25 MG tablet Take 0.25 mg by mouth 2 (two) times daily as needed for anxiety.  ? aspirin 81 MG chewable tablet Chew 81 mg by mouth daily.  ? atorvastatin (LIPITOR) 80 MG tablet Take 80 mg by mouth every evening.  ? celecoxib (CELEBREX) 200 MG capsule Take 200 mg by mouth 2 (two) times daily.  ? cetirizine (ZYRTEC) 10 MG tablet Take 10 mg by mouth 2 (two) times daily.  ? clopidogrel (PLAVIX) 75 MG tablet TAKE ONE TABLET BY MOUTH EVERY DAY  ? glimepiride (AMARYL) 4  MG tablet Take 4 mg by mouth 2 (two) times daily.  ? metFORMIN (GLUCOPHAGE) 850 MG tablet Take 850 mg by mouth 2 (two) times daily.  ? nitroGLYCERIN (NITROSTAT) 0.4 MG SL tablet Place 1 tablet (0.4 mg total) under the tongue every 5 (five) minutes as needed for chest pain.  ? omeprazole (PRILOSEC) 20 MG capsule Take 20 mg by mouth daily.  ? ranolazine (RANEXA) 500 MG 12 hr tablet Take 1 tablet (500 mg total) by mouth 2 (two) times daily.  ? [DISCONTINUED] nitroGLYCERIN (NITROSTAT) 0.4 MG SL tablet Place 1 tablet (0.4 mg total) under the tongue every 5 (five) minutes as needed for chest pain.  ?  ? ?Allergies:   Spironolactone and Angiotensin receptor blockers  ? ?Social History  ? ?Socioeconomic History  ? Marital status: Married  ?  Spouse name: Not on file  ? Number of children: Not on file  ? Years of education: Not on file  ? Highest education level: Not on file  ?Occupational History  ? Not on file  ?Tobacco Use  ? Smoking status: Former  ?  Types: Cigarettes  ?  Quit date: 12/05/2017  ?  Years since quitting: 4.2  ? Smokeless tobacco: Never  ?Vaping Use  ? Vaping Use: Never used  ?Substance and Sexual Activity  ? Alcohol use: Yes  ?  Comment: occ  ? Drug use: Never  ? Sexual activity: Not on file  ?Other Topics Concern  ? Not on file  ?Social History Narrative  ? Not on file  ? ?Social Determinants of Health  ? ?Financial Resource Strain: Not on file  ?Food Insecurity: Not on file  ?Transportation Needs: Not on file  ?Physical Activity: Not on file  ?Stress: Not on file  ?Social Connections: Not on file  ?  ? ?Family History: ?The patient's family history includes Colon polyps in his brother, mother, and sister; Colon polyps (age of onset: 44) in his son; Familial polyposis in his son. There is no history of Colon cancer. ?ROS:   ?Please see the history of present illness.    ?All other systems reviewed and are negative. ? ?EKGs/Labs/Other Studies Reviewed:   ? ?The following studies were reviewed today: ? ?Left  heart catheterization 09/13/2019: ?Procedures ?LEFT HEART CATH AND CORONARY ANGIOGRAPHY  ? ?Conclusion ?Previously placed Prox LAD to Mid LAD stent (unknown type) is widely patent. ?Previously placed Prox Cx to Sharma Covert

## 2022-02-21 ENCOUNTER — Other Ambulatory Visit: Payer: Self-pay

## 2022-02-21 ENCOUNTER — Ambulatory Visit (INDEPENDENT_AMBULATORY_CARE_PROVIDER_SITE_OTHER): Payer: Managed Care, Other (non HMO) | Admitting: Cardiology

## 2022-02-21 ENCOUNTER — Telehealth: Payer: Self-pay | Admitting: *Deleted

## 2022-02-21 ENCOUNTER — Encounter: Payer: Self-pay | Admitting: Cardiology

## 2022-02-21 VITALS — BP 110/70 | HR 62 | Ht 68.0 in | Wt 195.0 lb

## 2022-02-21 DIAGNOSIS — I2510 Atherosclerotic heart disease of native coronary artery without angina pectoris: Secondary | ICD-10-CM | POA: Diagnosis not present

## 2022-02-21 DIAGNOSIS — E782 Mixed hyperlipidemia: Secondary | ICD-10-CM | POA: Diagnosis not present

## 2022-02-21 DIAGNOSIS — I255 Ischemic cardiomyopathy: Secondary | ICD-10-CM

## 2022-02-21 DIAGNOSIS — I10 Essential (primary) hypertension: Secondary | ICD-10-CM

## 2022-02-21 DIAGNOSIS — I952 Hypotension due to drugs: Secondary | ICD-10-CM

## 2022-02-21 MED ORDER — NITROGLYCERIN 0.4 MG SL SUBL
0.4000 mg | SUBLINGUAL_TABLET | SUBLINGUAL | 3 refills | Status: DC | PRN
Start: 2022-02-21 — End: 2023-04-28

## 2022-02-21 NOTE — Telephone Encounter (Signed)
Can you please review pt chart and advise if he can be done in [place] ?

## 2022-02-21 NOTE — Patient Instructions (Signed)
Medication Instructions:  ?Your physician recommends that you continue on your current medications as directed. Please refer to the Current Medication list given to you today. ? ? ?Stop your aspirin and clopidogrel 1 week before your sigmoidoscopy. Then restart clopidogrel only. ? ?*If you need a refill on your cardiac medications before your next appointment, please call your pharmacy* ? ? ?Lab Work: ?None ordered ?If you have labs (blood work) drawn today and your tests are completely normal, you will receive your results only by: ?MyChart Message (if you have MyChart) OR ?A paper copy in the mail ?If you have any lab test that is abnormal or we need to change your treatment, we will call you to review the results. ? ? ?Testing/Procedures: ?Your physician has requested that you have an echocardiogram. Echocardiography is a painless test that uses sound waves to create images of your heart. It provides your doctor with information about the size and shape of your heart and how well your heart?s chambers and valves are working. This procedure takes approximately one hour. There are no restrictions for this procedure. ? ? ? ?Follow-Up: ?At [facility], you and your health needs are our priority.  As part of our continuing mission to provide you with exceptional heart care, we have created designated Provider Care Teams.  These Care Teams include your primary Cardiologist (physician) and Advanced Practice Providers (APPs -  Physician Assistants and Nurse Practitioners) who all work together to provide you with the care you need, when you need it. ? ?We recommend signing up for the patient portal called "MyChart".  Sign up information is provided on this After Visit Summary.  MyChart is used to connect with patients for Virtual Visits (Telemedicine).  Patients are able to view lab/test results, encounter notes, upcoming appointments, etc.  Non-urgent messages can be sent to your provider as well.   ?To learn more  about what you can do with MyChart, go to [email or web address].   ? ?Your next appointment:   ?6 month(s) ? ?The format for your next appointment:   ?In Person ? ?Provider:   ?Jyl Heinz, MD ? ? ?Other Instructions ?Echocardiogram ?An echocardiogram is a test that uses sound waves (ultrasound) to produce images of the heart. ?Images from an echocardiogram can provide important information about: ?Heart size and shape. ?The size and thickness and movement of your heart's walls. ?Heart muscle function and strength. ?Heart valve function or if you have stenosis. Stenosis is when the heart valves are too narrow. ?If blood is flowing backward through the heart valves (regurgitation). ?A tumor or infectious growth around the heart valves. ?Areas of heart muscle that are not working well because of poor blood flow or injury from a heart attack. ?Aneurysm detection. An aneurysm is a weak or damaged part of an artery wall. The wall bulges out from the normal force of blood pumping through the body. ?Tell a health care provider about: ?Any allergies you have. ?All medicines you are taking, including vitamins, herbs, eye drops, creams, and over-the-counter medicines. ?Any blood disorders you have. ?Any surgeries you have had. ?Any medical conditions you have. ?Whether you are pregnant or may be pregnant. ?What are the risks? ?Generally, this is a safe test. However, problems may occur, including an allergic reaction to dye (contrast) that may be used during the test. ?What happens before the test? ?No specific preparation is needed. You may eat and drink normally. ?What happens during the test? ?You will take off your clothes from the  waist up and put on a hospital gown. ?Electrodes or electrocardiogram (ECG)patches may be placed on your chest. The electrodes or patches are then connected to a device that monitors your heart rate and rhythm. ?You will lie down on a table for an ultrasound exam. A gel will be applied to  your chest to help sound waves pass through your skin. ?A handheld device, called a transducer, will be pressed against your chest and moved over your heart. The transducer produces sound waves that travel to your heart and bounce back (or "echo" back) to the transducer. These sound waves will be captured in real-time and changed into images of your heart that can be viewed on a video monitor. The images will be recorded on a computer and reviewed by your health care provider. ?You may be asked to change positions or hold your breath for a short time. This makes it easier to get different views or better views of your heart. ?In some cases, you may receive contrast through an IV in one of your veins. This can improve the quality of the pictures from your heart. ?The procedure may vary among health care providers and hospitals.   ?What can I expect after the test? ?You may return to your normal, everyday life, including diet, activities, and medicines, unless your health care provider tells you not to do that. ?Follow these instructions at home: ?It is up to you to get the results of your test. Ask your health care provider, or the department that is doing the test, when your results will be ready. ?Keep all follow-up visits. This is important. ?Summary ?An echocardiogram is a test that uses sound waves (ultrasound) to produce images of the heart. ?Images from an echocardiogram can provide important information about the size and shape of your heart, heart muscle function, heart valve function, and other possible heart problems. ?You do not need to do anything to prepare before this test. You may eat and drink normally. ?After the echocardiogram is completed, you may return to your normal, everyday life, unless your health care provider tells you not to do that. ?This information is not intended to replace advice given to you by your health care provider. Make sure you discuss any questions you have with your health  care provider. ?Document Revised: 07/14/2020 Document Reviewed: 07/14/2020 ?Elsevier Patient Education ? [place]. ? ? ?

## 2022-02-22 ENCOUNTER — Telehealth: Payer: Self-pay | Admitting: *Deleted

## 2022-02-22 NOTE — Telephone Encounter (Signed)
Okay to hold Plavix per last note per cardiology ?RG ?

## 2022-02-22 NOTE — Telephone Encounter (Signed)
This patient has been cleared ?

## 2022-02-22 NOTE — Telephone Encounter (Signed)
Attempted to call x3,message left with call back number to return call by 5 pm today to reschedule pre-visit or upcoming procedure will be cancelled. ?

## 2022-02-22 NOTE — Telephone Encounter (Signed)
Pt.called back and rescheduled for 02/23/22 ?

## 2022-02-23 ENCOUNTER — Ambulatory Visit (AMBULATORY_SURGERY_CENTER): Payer: Managed Care, Other (non HMO) | Admitting: *Deleted

## 2022-02-23 ENCOUNTER — Other Ambulatory Visit: Payer: Self-pay

## 2022-02-23 VITALS — Ht 68.0 in | Wt 195.0 lb

## 2022-02-23 DIAGNOSIS — Z8371 Family history of colonic polyps: Secondary | ICD-10-CM

## 2022-02-23 NOTE — Progress Notes (Signed)
No egg or soy allergy known to patient  ?No issues known to pt with past sedation with any surgeries or procedures ?Patient denies ever being told they had issues or difficulty with intubation  ?No FH of Malignant Hyperthermia ?Pt is not on diet pills ?Pt is not on  home 02  ?Pt is on blood thinners plavix- we have hold x 1 week  ?Pt denies issues with constipation  ?No A fib or A flutter ?Pt will not do sedation for flex sig  ?Pt is fully vaccinated  for Covid  ? ?Due to the COVID-19 pandemic we are asking patients to follow certain guidelines in PV and the [place]   ?Pt aware of COVID protocols and [facility] guidelines  ? ?PV completed over the phone. Pt verified name, DOB, address and insurance during PV today.  ?Pt mailed instruction packet with copy of consent form to read and not return, and instructions.  ?Pt encouraged to call with questions or issues.  ?If pt has My chart, procedure instructions sent via My Chart  ? ?

## 2022-02-24 ENCOUNTER — Other Ambulatory Visit: Payer: Managed Care, Other (non HMO)

## 2022-02-24 ENCOUNTER — Other Ambulatory Visit: Payer: Self-pay | Admitting: Cardiology

## 2022-02-25 ENCOUNTER — Other Ambulatory Visit: Payer: Self-pay

## 2022-02-25 ENCOUNTER — Ambulatory Visit (INDEPENDENT_AMBULATORY_CARE_PROVIDER_SITE_OTHER): Payer: Managed Care, Other (non HMO)

## 2022-02-25 DIAGNOSIS — I2510 Atherosclerotic heart disease of native coronary artery without angina pectoris: Secondary | ICD-10-CM | POA: Diagnosis not present

## 2022-02-25 DIAGNOSIS — I5030 Unspecified diastolic (congestive) heart failure: Secondary | ICD-10-CM | POA: Diagnosis not present

## 2022-02-25 DIAGNOSIS — I255 Ischemic cardiomyopathy: Secondary | ICD-10-CM | POA: Diagnosis not present

## 2022-02-25 DIAGNOSIS — I517 Cardiomegaly: Secondary | ICD-10-CM

## 2022-02-25 DIAGNOSIS — I5189 Other ill-defined heart diseases: Secondary | ICD-10-CM

## 2022-02-25 LAB — ECHOCARDIOGRAM COMPLETE
Area-P 1/2: 5.27 cm2
Calc EF: 44.4 %
S' Lateral: 4.1 cm
Single Plane A2C EF: 45 %
Single Plane A4C EF: 47 %

## 2022-02-28 ENCOUNTER — Telehealth: Payer: Self-pay | Admitting: Cardiology

## 2022-02-28 NOTE — Telephone Encounter (Signed)
?*  STAT* If patient is at the pharmacy, call can be transferred to refill team. ? ? ?1. Which medications need to be refilled? (please list name of each medication and dose if known)  ?ranolazine (RANEXA) 500 MG 12 hr tablet ? ?2. Which pharmacy/location (including street and city if local pharmacy) is medication to be sent to? [facility] [place] [place] - [address]. Do they need a 30 day or 90 day supply? 90 day supply   ?

## 2022-03-01 MED ORDER — RANOLAZINE ER 500 MG PO TB12: 500.0000 mg | ORAL_TABLET | Freq: Two times a day (BID) | ORAL | 3 refills | Status: DC

## 2022-03-01 NOTE — Telephone Encounter (Signed)
Medication sent to requested pharmacy.

## 2022-03-05 ENCOUNTER — Encounter: Payer: Self-pay | Admitting: Internal Medicine

## 2022-03-06 ENCOUNTER — Encounter: Payer: Self-pay | Admitting: Certified Registered Nurse Anesthetist

## 2022-03-14 ENCOUNTER — Telehealth: Payer: Self-pay

## 2022-03-14 ENCOUNTER — Ambulatory Visit (AMBULATORY_SURGERY_CENTER): Payer: Managed Care, Other (non HMO) | Admitting: Gastroenterology

## 2022-03-14 ENCOUNTER — Encounter: Payer: Self-pay | Admitting: Gastroenterology

## 2022-03-14 ENCOUNTER — Other Ambulatory Visit: Payer: Managed Care, Other (non HMO) | Admitting: Gastroenterology

## 2022-03-14 VITALS — BP 121/69 | HR 63 | Temp 97.3°F | Resp 14 | Ht 68.0 in | Wt 195.0 lb

## 2022-03-14 DIAGNOSIS — Z1509 Genetic susceptibility to other malignant neoplasm: Secondary | ICD-10-CM

## 2022-03-14 DIAGNOSIS — Z8371 Family history of colonic polyps: Secondary | ICD-10-CM

## 2022-03-14 DIAGNOSIS — Z9049 Acquired absence of other specified parts of digestive tract: Secondary | ICD-10-CM | POA: Diagnosis not present

## 2022-03-14 DIAGNOSIS — Z85048 Personal history of other malignant neoplasm of rectum, rectosigmoid junction, and anus: Secondary | ICD-10-CM

## 2022-03-14 MED ORDER — SODIUM CHLORIDE 0.9 % IV SOLN: 500.0000 mL | Freq: Once | INTRAVENOUS | Status: DC

## 2022-03-14 NOTE — Progress Notes (Signed)
Vitals-DT  Pt's states no medical or surgical changes since previsit or office visit.  

## 2022-03-14 NOTE — Op Note (Addendum)
[place] ?Patient Name: Larry Warner ?Procedure Date: 03/14/2022 11:06 AM ?MRN: [id number] ?Endoscopist: Jackquline Denmark , MD ?Age: 66 ?Referring MD:  ?Date of Birth: 1956/07/12 ?Gender: Male ?Account #: [id number] ?Procedure:                Flexible Sigmoidoscopy ?Indications:              Colon cancer screening in patient at increased  ?                          risk: FAP s/p subtotal colectomy with ileorectal  ?                          anastomosis 02/2011 (Dr Darnell Level. [place]). 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                          (Q.034+742V>Z). ?Medicines:                Monitored Anesthesia Care ?Procedure:                Pre-Anesthesia Assessment: ?                          - Prior to the procedure, a History and Physical  ?                          was performed, and patient medications and  ?                          allergies were reviewed. The patient's tolerance of  ?                          previous anesthesia was also reviewed. The risks  ?                          and benefits of the procedure and the sedation  ?                          options and risks were discussed with the patient.  ?                          All questions were answered, and informed consent  ?                          was obtained. Prior Anticoagulants: The patient has  ?                          taken no previous anticoagulant or antiplatelet  ?                          agents. ASA Grade Assessment: II - A patient with  ?  mild systemic disease. After reviewing the risks  ?                          and benefits, the patient was deemed in  ?                          satisfactory condition to undergo the procedure. ?                          After obtaining informed consent, the scope was  ?                          passed under direct vision. The Olympus PCF-H190DL  ?                          [id number]) Colonoscope was introduced  through the  ?                          anus and advanced to the the rectosigmoid junction.  ?                          Then after 5 to 6 cm of neo-TI was intubated. The  ?                          flexible sigmoidoscopy was accomplished without  ?                          difficulty. The patient tolerated the procedure  ?                          well. The quality of the bowel preparation was good. ?Scope In: 11:20:51 AM ?Scope Out: 11:25:13 AM ?Total Procedure Duration: 0 hours 4 minutes 22 seconds  ?Findings:                 There was evidence of a prior end-to-end  ?                          ileo-colonic anastomosis at 15 cm proximal to the  ?                          anus. This was patent and was characterized by  ?                          healthy appearing mucosa. ?                          Non-bleeding external and internal hemorrhoids were  ?                          found during retroflexion and during perianal exam.  ?                          The hemorrhoids were mild. Healed posterior anal  ?  fissure was also noted. ?Complications:            No immediate complications. ?Estimated Blood Loss:     Estimated blood loss: none. ?Impression:               - Patent end-to-end ileo-colonic anastomosis,  ?                          characterized by healthy appearing mucosa. ?                          - Non-bleeding external and internal hemorrhoids.  ?                          Healed posterior anal fissure ?                          - No specimens collected. ?Recommendation:           - Discharge patient to home. ?                          - Resume previous diet. ?                          - Continue present medications. ?                          - Repeat flexible sigmoidoscopy in 2 year for  ?                          screening purposes. ?                          - The findings and recommendations were discussed  ?                          with the patient's family. ?Jackquline Denmark,  MD ?03/14/2022 11:35:14 AM ?This report has been signed electronically. ?

## 2022-03-14 NOTE — Telephone Encounter (Signed)
Documented under Pt Chart ?

## 2022-03-14 NOTE — Progress Notes (Signed)
Report given to PACU, vss 

## 2022-03-14 NOTE — Telephone Encounter (Signed)
-----   Message from Jackquline Denmark, MD sent at 03/14/2022 11:47 AM EDT ----- ?Regarding: Son needs colonoscopy ?Pt with FAP (familial adenomatous polyposis) ?His son is a patient of mine to ?Do not remember his name ? ?Son needs colonoscopy. ?Please arrange it directly with me ? ?RG ? ?

## 2022-03-14 NOTE — Progress Notes (Signed)
? ? ?Chief Complaint: FU ? ?Referring Provider:  [name], MD    ? ? ?ASSESSMENT AND PLAN;  ? ?#1. 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 [place]). 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 ? ?#2.  Comorbid conditions CAD s/p DES 12/2017 (off Brilinta June end 2020), HTN, HLD, LVD (EF 45-50%), DM2. ? ?Plan: ?-FS ? ?HPI:   ? ?Larry Warner. is a 66 y.o. male  ?With H/O CAD (s/p anterior STEMI with PCI/DES to LAD and LCx 12/2017) on plavix, ICM (EF 45-50% 05/2019), HTN, HLD, DM2 ?For follow-up visit. ? ?No nausea, vomiting, heartburn, regurgitation, odynophagia or dysphagia.  No significant diarrhea or constipation. There is no melena or hematochezia. No unintentional weight loss. ? ?Has been under care of geneticist Santiago Glad as well. ? ?Wt Readings from Last 3 Encounters:  ?03/14/22 195 lb (88.5 kg)  ?02/23/22 195 lb (88.5 kg)  ?02/21/22 195 lb (88.5 kg)  ? ? ? ?Past GI procedures: ?-Had several flexible sigmoidoscopies since surgery.  Was initially having it every 6 months.  Last FS 06/2019: Small external and internal hemorrhoids, otherwise normal to anastomosis. ?-Several EGDs.  Last EGD 01/04/2017 (EGD scope and ERCP scope)-mild gastritis.  Otherwise normal.  No periampullary tumors. ?Past Medical History:  ?Diagnosis Date  ? Abnormal nuclear cardiac imaging test   ? CAD (coronary artery disease) 02/28/2018  ? 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 Dizziness 03/14/2018  ? Essential hypertension 01/01/2018  ? Familial adenomatous polyposis gene positive 12/28/2018  ? APC P.102+585I>D  ? Family history of colonic polyps   ? Family history of FAP (familial adenomatous polyposis)   ? Genetic testing 12/28/2018  ? APC P.824+235T>I 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  The report date is 12/28/2018.  ? GERD (gastroesophageal reflux disease)   ? Heart attack ([place])   ? History of colon polyps 12/17/2018  ?  Hyperglycemia 01/01/2018  ? Hyperlipidemia   ? Jaundice of recent onset 03/14/2018  ? Left ventricular dysfunction 05/10/2019  ? Nicotine abuse 01/01/2018  ? Plantar fasciitis 07/13/2017  ? ST elevation myocardial infarction involving left anterior descending (LAD) coronary artery ([place]) 01/01/2018  ? Formatting of this note might be different from the original. Added automatically from request for surgery [phone]  ? Type 2 diabetes mellitus with circulatory disorder ([place]) 01/30/2018  ? Unstable angina ([place]) 09/13/2019  ? Wears glasses   ? ? ?Past Surgical History:  ?Procedure Laterality Date  ? 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 BIOPSY  01/11/2021  ? Procedure: BIOPSY;  Surgeon: Jackquline Denmark, MD;  Location: [facility];  Service: Endoscopy;;  ? CARDIAC CATHETERIZATION    ? 2019 stents  ? COLONOSCOPY    ? CYSTOSCOPY  2022  ? removed scaring  ? ESOPHAGOGASTRODUODENOSCOPY  01/04/2017  ? Mild gastrtis. Otherwise normal EGD with convetional scope and ERCP scope. No periampullary tumors.   ? ESOPHAGOGASTRODUODENOSCOPY (EGD) WITH PROPOFOL N/A 01/11/2021  ? Procedure: ESOPHAGOGASTRODUODENOSCOPY (EGD) WITH PROPOFOL;  Surgeon: Jackquline Denmark, MD;  Location: [facility];  Service: Endoscopy;  Laterality: N/A;  w/ convientional and ERCP scope   ? FLEXIBLE SIGMOIDOSCOPY N/A 01/11/2021  ? Procedure: FLEXIBLE SIGMOIDOSCOPY;  Surgeon: Jackquline Denmark, MD;  Location: [facility];  Service: Endoscopy;  Laterality: N/A;  ? Laprascopic assisted subtotal colectomy    ? LEFT HEART CATH AND CORONARY ANGIOGRAPHY N/A 09/13/2019  ? Procedure: LEFT HEART  CATH AND CORONARY ANGIOGRAPHY;  Surgeon: Martinique, Peter M, MD;  Location: [facility];  Service: Cardiovascular;  Laterality: N/A;  ? LITHOTRIPSY  2022  ? kidney stones  ? SIGMOIDOSCOPY    ? UPPER GASTROINTESTINAL ENDOSCOPY    ? WRIST SURGERY Left 2002  ? plate  screws  ? ? ?Family History  ?Problem Relation Age of Onset  ? Colon polyps  Mother   ?     <12 polyps  ? Colon polyps Sister   ? Colon polyps Brother   ?     had colectomy due to polyp count  ? Colon polyps Son 56  ?     total colectomy  ? Familial polyposis Son   ?     APC mutation  ? Colon cancer Neg Hx   ? Esophageal cancer Neg Hx   ? Rectal cancer Neg Hx   ? Stomach cancer Neg Hx   ? ? ?Social History  ? ?Tobacco Use  ? Smoking status: Former  ?  Types: Cigarettes  ?  Quit date: 12/05/2017  ?  Years since quitting: 4.2  ? Smokeless tobacco: Never  ?Vaping Use  ? Vaping Use: Never used  ?Substance Use Topics  ? Alcohol use: Yes  ?  Comment: occ  ? Drug use: Never  ? ? ?Current Outpatient Medications  ?Medication Sig Dispense Refill  ? ALPRAZolam (XANAX) 0.25 MG tablet Take 0.25 mg by mouth 2 (two) times daily as needed for anxiety.    ? atorvastatin (LIPITOR) 80 MG tablet Take 80 mg by mouth every evening.    ? celecoxib (CELEBREX) 200 MG capsule Take 200 mg by mouth 2 (two) times daily.    ? cetirizine (ZYRTEC) 10 MG tablet Take 10 mg by mouth 2 (two) times daily.    ? metFORMIN (GLUCOPHAGE) 850 MG tablet Take 850 mg by mouth 2 (two) times daily.    ? omeprazole (PRILOSEC) 20 MG capsule Take 20 mg by mouth daily.    ? ranolazine (RANEXA) 500 MG 12 hr tablet Take 1 tablet (500 mg total) by mouth 2 (two) times daily. 180 tablet 3  ? clopidogrel (PLAVIX) 75 MG tablet TAKE ONE TABLET BY MOUTH EVERY DAY 90 tablet 2  ? glimepiride (AMARYL) 4 MG tablet Take 4 mg by mouth 2 (two) times daily.    ? nitroGLYCERIN (NITROSTAT) 0.4 MG SL tablet Place 1 tablet (0.4 mg total) under the tongue every 5 (five) minutes as needed for chest pain. 25 tablet 3  ? ?Current Facility-Administered Medications  ?Medication Dose Route Frequency Provider Last Rate Last Admin  ? 0.9 %  sodium chloride infusion  500 mL Intravenous Once Jackquline Denmark, MD      ? ? ?Allergies  ?Allergen Reactions  ? Spironolactone Other (See Comments)  ?  Mild hyperkalemia  ? Angiotensin Receptor Blockers Rash  ?  With losartan   ? ? ?Review of Systems:  ?neg ? ?  ? ?Physical Exam:   ? ?BP 117/72 (BP Location: Right Arm, Patient Position: Sitting, Cuff Size: Normal)   Pulse 68   Temp (!) 97.3 ?F (36.3 ?C) (Temporal)   Ht '5\' 8"'$  (1.727 m)   Wt 195 lb (88.5 kg)   SpO2 98%   BMI 29.65 kg/m?  ?Filed Weights  ? 03/14/22 1045  ?Weight: 195 lb (88.5 kg)  ?Gen: awake, alert, NAD ?HEENT: anicteric, no pallor ?CV: RRR, no mrg ?Pulm: CTA b/l ?Abd: soft, NT/ND, +BS throughout ?Ext: no c/c/e ?Neuro: nonfocal ? ? ? ?  Carmell Austria, MD 03/14/2022, 11:11 AM ? ?Cc: [name], MD ? ? ?

## 2022-03-14 NOTE — Patient Instructions (Signed)
? ? ?  HANDOUT ON HEMORRHOIDS GIVEN TO YOU TODAY ? ? ?YOU HAD AN ENDOSCOPIC PROCEDURE TODAY AT [facility]:   Refer to the procedure report that was given to you for any specific questions about what was found during the examination.  If the procedure report does not answer your questions, please call your gastroenterologist to clarify.  If you requested that your care partner not be given the details of your procedure findings, then the procedure report has been included in a sealed envelope for you to review at your convenience later. ? ?YOU SHOULD EXPECT: Some feelings of bloating in the abdomen. Passage of more gas than usual.  Walking can help get rid of the air that was put into your GI tract during the procedure and reduce the bloating. If you had a lower endoscopy (such as a colonoscopy or flexible sigmoidoscopy) you may notice spotting of blood in your stool or on the toilet paper. If you underwent a bowel prep for your procedure, you may not have a normal bowel movement for a few days. ? ?Please Note:  You might notice some irritation and congestion in your nose or some drainage.  This is from the oxygen used during your procedure.  There is no need for concern and it should clear up in a day or so. ? ?SYMPTOMS TO REPORT IMMEDIATELY: ? ?Following lower endoscopy (colonoscopy or flexible sigmoidoscopy): ? Excessive amounts of blood in the stool ? Significant tenderness or worsening of abdominal pains ? Swelling of the abdomen that is new, acute ? Fever of 100?F or higher ? ? ?For urgent or emergent issues, a gastroenterologist can be reached at any hour by calling [phone]. ?Do not use MyChart messaging for urgent concerns.  ? ? ?DIET:  We do recommend a small meal at first, but then you may proceed to your regular diet.  Drink plenty of fluids but you should avoid alcoholic beverages for 24 hours. ? ?ACTIVITY:  You should plan to take it easy for the rest of today and you should NOT  DRIVE or use heavy machinery until tomorrow (because of the sedation medicines used during the test).   ? ?FOLLOW UP: ?Our staff will call the number listed on your records 48-72 hours following your procedure to check on you and address any questions or concerns that you may have regarding the information given to you following your procedure. If we do not reach you, we will leave a message.  We will attempt to reach you two times.  During this call, we will ask if you have developed any symptoms of COVID 19. If you develop any symptoms (ie: fever, flu-like symptoms, shortness of breath, cough etc.) before then, please call [phone].  If you test positive for Covid 19 in the 2 weeks post procedure, please call and report this information to [place].   ? ?If any biopsies were taken you will be contacted by phone or by letter within the next 1-3 weeks.  Please call us at [phone] if you have not heard about the biopsies in 3 weeks.  ? ? ?SIGNATURES/CONFIDENTIALITY: ?You and/or your care partner have signed paperwork which will be entered into your electronic medical record.  These signatures attest to the fact that that the information above on your After Visit Summary has been reviewed and is understood.  Full responsibility of the confidentiality of this discharge information lies with you and/or your care-partner.  ? ? ?

## 2022-03-16 ENCOUNTER — Telehealth: Payer: Self-pay

## 2022-03-16 NOTE — Telephone Encounter (Signed)
?  Follow up Call- ? ? ?  03/14/2022  ? 10:45 AM 06/27/2019  ?  7:36 AM  ?Call back number  ?Post procedure Call Back phone  # [phone]  ?Permission to leave phone message Yes Yes  ?  ? ?Patient questions: ? ?Do you have a fever, pain , or abdominal swelling? No. ?Pain Score  0 * ? ?Have you tolerated food without any problems? Yes.   ? ?Have you been able to return to your normal activities? Yes.   ? ?Do you have any questions about your discharge instructions: ?Diet   No. ?Medications  No. ?Follow up visit  No. ? ?Do you have questions or concerns about your Care? No. ? ?Actions: ?* If pain score is 4 or above: ?No action needed, pain <4. ? ? ?

## 2022-03-23 ENCOUNTER — Other Ambulatory Visit: Payer: Managed Care, Other (non HMO) | Admitting: Gastroenterology

## 2022-04-22 ENCOUNTER — Other Ambulatory Visit: Payer: Managed Care, Other (non HMO) | Admitting: Gastroenterology

## 2022-05-17 ENCOUNTER — Telehealth: Payer: Self-pay

## 2022-05-17 NOTE — Telephone Encounter (Signed)
   Pre-operative Risk Assessment    Patient Name: Larry Warner.  DOB: 08/08/1956 MRN: [id number]      Request for Surgical Clearance    Procedure:   Right Shoulder Arthroscopic Rotator Cuff Repair with Distal Clavicle Excision and Subacromial Decompression  Date of Surgery:  Clearance 05/19/22                                 Surgeon:  Joya Salm, MD Surgeon's Group or Practice Name:  [facility] Phone number:  [phone] Fax number:  [phone]   Type of Clearance Requested:   - Pharmacy:  Hold Clopidogrel (Plavix) Plavix   Type of Anesthesia:  General    Additional requests/questions:    Daylene Katayama   05/17/2022, 11:50 AM

## 2022-05-18 ENCOUNTER — Telehealth: Payer: Self-pay | Admitting: *Deleted

## 2022-05-18 NOTE — Telephone Encounter (Signed)
I called and s/w the pt he tells me that his surgery has been moved out to 05/27/22. Pt agreeable to plan of care for a tele pre op appt 05/19/22 @ 2 pm. Med rec and consent are done.     Patient Consent for Virtual Visit        Larry Warner. has provided verbal consent on 05/18/2022 for a virtual visit (video or telephone).   CONSENT FOR VIRTUAL VISIT FOR:  Larry Warner.  By participating in this virtual visit I agree to the following:  I hereby voluntarily request, consent and authorize [facility] and its employed or contracted physicians, physician assistants, nurse practitioners or other licensed health care professionals (the Practitioner), to provide me with telemedicine health care services (the "Services") as deemed necessary by the treating Practitioner. I acknowledge and consent to receive the Services by the Practitioner via telemedicine. I understand that the telemedicine visit will involve communicating with the Practitioner through live audiovisual communication technology and the disclosure of certain medical information by electronic transmission. I acknowledge that I have been given the opportunity to request an in-person assessment or other available alternative prior to the telemedicine visit and am voluntarily participating in the telemedicine visit.  I understand that I have the right to withhold or withdraw my consent to the use of telemedicine in the course of my care at any time, without affecting my right to future care or treatment, and that the Practitioner or I may terminate the telemedicine visit at any time. I understand that I have the right to inspect all information obtained and/or recorded in the course of the telemedicine visit and may receive copies of available information for a reasonable fee.  I understand that some of the potential risks of receiving the Services via telemedicine include:  Delay or interruption in medical evaluation due to  technological equipment failure or disruption; Information transmitted may not be sufficient (e.g. poor resolution of images) to allow for appropriate medical decision making by the Practitioner; and/or  In rare instances, security protocols could fail, causing a breach of personal health information.  Furthermore, I acknowledge that it is my responsibility to provide information about my medical history, conditions and care that is complete and accurate to the best of my ability. I acknowledge that Practitioner's advice, recommendations, and/or decision may be based on factors not within their control, such as incomplete or inaccurate data provided by me or distortions of diagnostic images or specimens that may result from electronic transmissions. I understand that the practice of medicine is not an exact science and that Practitioner makes no warranties or guarantees regarding treatment outcomes. I acknowledge that a copy of this consent can be made available to me via my patient portal ([facility]), or I can request a printed copy by calling the office of [facility].    I understand that my insurance will be billed for this visit.   I have read or had this consent read to me. I understand the contents of this consent, which adequately explains the benefits and risks of the Services being provided via telemedicine.  I have been provided ample opportunity to ask questions regarding this consent and the Services and have had my questions answered to my satisfaction. I give my informed consent for the services to be provided through the use of telemedicine in my medical care

## 2022-05-18 NOTE — Telephone Encounter (Signed)
Primary Cardiologist:Brian Bettina Gavia, MD  Chart reviewed as part of pre-operative protocol coverage. Because of Larry BORUNDA Jr.'s past medical history and time since last visit, he/she will require a virtual visit/telephone call in order to better assess preoperative cardiovascular risk.  Pre-op covering staff: - Please contact patient, obtain consent, and schedule appointment   Per Dr. Bettina Gavia on 02/21/22, patient was advised to hold Plavix and aspirin for 1 week prior to sigmoidoscopy and then resume Plavix only due to bruising. In the setting of no additional coronary intervention, we will again hold Plavix for 1 week prior to shoulder surgery.  Will conduct telephone call in order to ensure no intervention outside of our health system.  Larry Life, NP-C    05/18/2022, 8:06 AM [facility] [address] Office [phone] Fax [phone]

## 2022-05-18 NOTE — Telephone Encounter (Signed)
I called and s/w the pt he tells me that his surgery has been moved out to 05/27/22. Pt agreeable to plan of care for a tele pre op appt 05/19/22 @ 2 pm. Med rec and consent are done.

## 2022-05-19 ENCOUNTER — Encounter: Payer: Self-pay | Admitting: Nurse Practitioner

## 2022-05-19 ENCOUNTER — Ambulatory Visit (INDEPENDENT_AMBULATORY_CARE_PROVIDER_SITE_OTHER): Payer: Managed Care, Other (non HMO) | Admitting: Nurse Practitioner

## 2022-05-19 DIAGNOSIS — Z01810 Encounter for preprocedural cardiovascular examination: Secondary | ICD-10-CM | POA: Diagnosis not present

## 2022-05-19 NOTE — Progress Notes (Signed)
Virtual Visit via Telephone Note   Because of Larry GIULIANI Jr.'s co-morbid illnesses, he is at least at moderate risk for complications without adequate follow up.  This format is felt to be most appropriate for this patient at this time.  The patient did not have access to video technology/had technical difficulties with video requiring transitioning to audio format only (telephone).  All issues noted in this document were discussed and addressed.  No physical exam could be performed with this format.  Please refer to the patient's chart for his consent to telehealth for [facility].  Evaluation Performed:  Preoperative cardiovascular risk assessment _____________   Date:  05/19/2022   Patient ID:  Larry Warner., DOB November 28, 1956, MRN [id number] Patient Location:  Home Provider location:   Office  Primary Care Provider:  [name], MD Primary Cardiologist:  [name], MD  Chief Complaint / Patient Profile   65 y.o. y/o male with a h/o CAD s/p anterior MI and PCI with DES to LAD and left circumflex arteries January 2019, ICM with LVEF 45-50% at that time, repeat echo 01/2021 revealed LVEF 35-40%, and echo 02/2022 revealed improvement to 40-45%,  hypertension and subsequent hypotension on antihypertensive therapy, hyperlipidemia,  who is pending right shoulder arthroscopic rotator cuff repair with distal clavicle excision and subacromial decompression and presents today for telephonic preoperative cardiovascular risk assessment.  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  The report date is 12/28/2018.   GERD (gastroesophageal reflux disease)    Heart attack ([place])    History of colon polyps 12/17/2018   Hyperglycemia 01/01/2018   Hyperlipidemia    Jaundice of recent onset 03/14/2018   Left ventricular dysfunction 05/10/2019   Nicotine abuse 01/01/2018   Plantar fasciitis 07/13/2017   ST elevation myocardial infarction involving left anterior descending (LAD) coronary artery ([place]) 01/01/2018   Formatting of this note might be different from the original. Added automatically from request for surgery [id number]   Type 2 diabetes mellitus with circulatory disorder ([place]) 01/30/2018   Unstable angina ([place]) 09/13/2019   Wears glasses    Past Surgical History:  Procedure Laterality Date   Attenuated FAP s/p laproscopic assisted subtotal colectomy with ileo recatal anastomosis     BIOPSY  01/11/2021   Procedure: BIOPSY;  Surgeon: Jackquline Denmark, MD;  [facility]: [facility];  Service: Endoscopy;;   CARDIAC CATHETERIZATION     2019 stents   COLONOSCOPY     CYSTOSCOPY  2022   removed scaring   ESOPHAGOGASTRODUODENOSCOPY  01/04/2017   Mild gastrtis. Otherwise normal EGD with convetional scope and ERCP scope. No periampullary tumors.    ESOPHAGOGASTRODUODENOSCOPY (EGD) WITH PROPOFOL N/A 01/11/2021   Procedure: ESOPHAGOGASTRODUODENOSCOPY (EGD) WITH PROPOFOL;  Surgeon: Jackquline Denmark, MD;  [facility];  Service: Endoscopy;  Laterality: N/A;  w/ convientional and ERCP scope    FLEXIBLE SIGMOIDOSCOPY N/A 01/11/2021   Procedure: FLEXIBLE SIGMOIDOSCOPY;  Surgeon: Jackquline Denmark, MD;  [facility];  Service: Endoscopy;  Laterality: N/A;  Laprascopic assisted subtotal colectomy     LEFT HEART CATH AND CORONARY ANGIOGRAPHY N/A 09/13/2019   Procedure: LEFT HEART CATH AND CORONARY ANGIOGRAPHY;  Surgeon: Martinique, Peter M, MD;  Location: [facility];  Service:  Cardiovascular;  Laterality: N/A;   LITHOTRIPSY  2022   kidney stones   SIGMOIDOSCOPY     UPPER GASTROINTESTINAL ENDOSCOPY     WRIST SURGERY Left 2002   plate  screws    Allergies  Allergies  Allergen Reactions   Spironolactone Other (See Comments)    Mild hyperkalemia   Angiotensin Receptor Blockers Rash    With losartan    History of Present Illness    Larry Warner. is a 66 y.o. male who presents via audio/video conferencing for a telehealth visit today.  Pt was last seen in cardiology clinic on 02/21/2022 by Dr. Bettina Gavia.  At that time Larry Warner. was doing well.  The patient is now pending procedure as outlined above. Since his last visit, he  denies chest pain, shortness of breath, lower extremity edema, fatigue, palpitations, melena, hematuria, hemoptysis, diaphoresis, weakness, presyncope, syncope, orthopnea, and PND.   Home Medications    Prior to Admission medications   Medication Sig Start Date End Date Taking? Authorizing Provider  ALPRAZolam (XANAX) 0.25 MG tablet Take 0.25 mg by mouth 2 (two) times daily as needed for anxiety. 12/17/19   [provider]  atorvastatin (LIPITOR) 80 MG tablet Take 80 mg by mouth every evening.    [provider]  celecoxib (CELEBREX) 200 MG capsule Take 200 mg by mouth 2 (two) times daily.    [provider]  cetirizine (ZYRTEC) 10 MG tablet Take 10 mg by mouth 2 (two) times daily.    [provider]  clopidogrel (PLAVIX) 75 MG tablet TAKE ONE TABLET BY MOUTH EVERY DAY 01/24/22   [name], MD  glimepiride (AMARYL) 4 MG tablet Take 4 mg by mouth 2 (two) times daily. 12/22/20   [provider]  metFORMIN (GLUCOPHAGE) 850 MG tablet Take 850 mg by mouth 2 (two) times daily.    [provider]  nitroGLYCERIN (NITROSTAT) 0.4 MG SL tablet Place 1 tablet (0.4 mg total) under the tongue every 5 (five) minutes as needed for chest pain. 02/21/22 02/21/23  [name], MD   omeprazole (PRILOSEC) 20 MG capsule Take 20 mg by mouth daily.    [provider]  ranolazine (RANEXA) 500 MG 12 hr tablet Take 1 tablet (500 mg total) by mouth 2 (two) times daily. 03/01/22   [name], MD    Physical Exam    Vital Signs:  Larry Warner. does not have vital signs available for review today.  Given telephonic nature of communication, physical exam is limited. AAOx3. NAD. Normal affect.  Speech and respirations are unlabored.  Accessory Clinical Findings    None  Assessment & Plan    1.  Preoperative Cardiovascular Risk Assessment: Patient is doing well from a cardiac perspective and may proceed to surgery without further testing. According to the Revised Cardiac Risk Index (RCRI), his Perioperative Risk of Major Cardiac Event is (%): 6.6. His Functional Capacity in METs is: 7.59 according to the [facility] Activity Status Index (DASI).  Per Dr. Bettina Gavia on 02/21/22, patient was advised to hold Plavix and aspirin for 1 week prior to sigmoidoscopy and then resume Plavix only due to bruising. In the setting of no additional coronary intervention, we will again hold Plavix  for 1 week prior to shoulder surgery.    A copy of this note will be routed to requesting surgeon.  Time:   Today, I have spent 10 minutes with the patient with telehealth technology discussing medical history, symptoms, and management plan.     Emmaline Life, NP-C    05/19/2022, 2:14 PM [place] [address] Office [phone] Fax [phone]

## 2022-08-05 ENCOUNTER — Ambulatory Visit: Payer: Managed Care, Other (non HMO) | Attending: Cardiology | Admitting: Cardiology

## 2022-08-05 ENCOUNTER — Encounter: Payer: Self-pay | Admitting: Cardiology

## 2022-08-05 VITALS — BP 124/72 | HR 70 | Ht 68.0 in | Wt 185.4 lb

## 2022-08-05 DIAGNOSIS — I2510 Atherosclerotic heart disease of native coronary artery without angina pectoris: Secondary | ICD-10-CM

## 2022-08-05 DIAGNOSIS — I10 Essential (primary) hypertension: Secondary | ICD-10-CM

## 2022-08-05 DIAGNOSIS — E782 Mixed hyperlipidemia: Secondary | ICD-10-CM | POA: Diagnosis not present

## 2022-08-05 DIAGNOSIS — I255 Ischemic cardiomyopathy: Secondary | ICD-10-CM | POA: Diagnosis not present

## 2022-08-05 DIAGNOSIS — E119 Type 2 diabetes mellitus without complications: Secondary | ICD-10-CM

## 2022-08-05 NOTE — Progress Notes (Signed)
Cardiology Office Note:    Date:  08/05/2022   ID:  Larry Warner., DOB [birth year], MRN [id number]  PCP:  [name], MD  Cardiologist:  [name], MD    Referring MD: [name], MD    ASSESSMENT:    1. Coronary artery disease involving native coronary artery of native heart without angina pectoris   2. Ischemic cardiomyopathy   3. Essential hypertension   4. Mixed hyperlipidemia   5. Type 2 diabetes mellitus without complication, without long-term current use of insulin (HCC)    PLAN:    In order of problems listed above:  [name] is doing well with CAD having no anginal discomfort after multivessel PCI and on current medical treatment we will continue clopidogrel along with lipid-lowering treatment and nitroglycerin if needed. Significant improvement in EF we will recheck an echocardiogram prior to his next visit in 9 months I will ask his PCP to consider transition or addition of SGLT2 inhibitor with his CAD and LV dysfunction Stable hypertension currently not on antihypertensive agents Lipids at target continue his high intensity statin Well-controlled diabetes, I will ask his PCP to consider addition or transition to SGLT2 inhibitor   Next appointment: 9 months   Medication Adjustments/Labs and Tests Ordered: Current medicines are reviewed at length with the patient today.  Concerns regarding medicines are outlined above.  No orders of the defined types were placed in this encounter.  No orders of the defined types were placed in this encounter.   Chief Complaint  Patient presents with   Coronary Artery Disease   Congestive Heart Failure    History of Present Illness:    Larry Warner. is a 66 y.o. male with a hx of CAD with anterior MI PCI and drug-eluting stent to LAD and left circumflex coronary arteries January 2019 left ventricular dysfunction most recent EF 35 to 40% February 2022 hypertensive heart disease hyperlipidemia and type 2 diabetes  last seen 02/21/2022.  Compliance with diet, lifestyle and medications: Yes  He underwent rotator cuff surgery with a good result uncomplicated. He is pleased with the quality of his life he is vigorous active he has had no angina shortness of breath chest pain palpitation or syncope Fasting blood sugar runs in the range of 100 210 mg percent his A1c is at target 6.3. Most recent lipid profile 04/15/2022 LDL 72 cholesterol 124. He tolerates his statin without muscle pain or weakness He is on single antiplatelet therapy with clopidogrel and no longer has excessive bruising  He had a follow-up echocardiogram performed 02/25/2022 showing stable or improved left ventricular function EF 40 to 45% normal right ventricular size function and moderate left atrial enlargement.  There is no valvular abnormality.   1. Akinetic anteroseptum. Left ventricular ejection fraction, by  estimation, is 40 to 45%. The left ventricle has mildly decreased  function. Left ventricular diastolic parameters are consistent with Grade  I diastolic dysfunction (impaired relaxation).   2. Right ventricular systolic function is normal. The right ventricular  size is normal.   3. Left atrial size was moderately dilated.   4. The mitral valve is normal in structure. No evidence of mitral valve  regurgitation. No evidence of mitral stenosis.   5. The aortic valve is normal in structure. Aortic valve regurgitation is  not visualized. No aortic stenosis is present.   6. The inferior vena cava is normal in size with greater than 50%  respiratory variability, suggesting right atrial pressure of 3 mmHg.  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  The report date is 12/28/2018.   GERD (gastroesophageal reflux disease)    Heart attack ([place])    History of colon polyps 12/17/2018   Hyperglycemia 01/01/2018   Hyperlipidemia    Jaundice of recent onset 03/14/2018   Left ventricular dysfunction 05/10/2019   Nicotine abuse 01/01/2018   Plantar fasciitis 07/13/2017   ST elevation myocardial infarction involving left anterior descending (LAD) coronary artery ([place]) 01/01/2018   Formatting of this note might be different from the original. 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 Otherwise normal EGD with convetional scope and ERCP scope. No periampullary tumors.    ESOPHAGOGASTRODUODENOSCOPY (EGD) WITH PROPOFOL N/A 01/11/2021   Procedure: ESOPHAGOGASTRODUODENOSCOPY (EGD) WITH PROPOFOL;  Surgeon: Jackquline Denmark, MD;  Location: [facility];  Service: Endoscopy;  Laterality: N/A;  w/ convientional and ERCP scope    FLEXIBLE SIGMOIDOSCOPY N/A 01/11/2021   Procedure: FLEXIBLE SIGMOIDOSCOPY;  Surgeon: Jackquline Denmark, MD;  Location: [facility];  Service: Endoscopy;  Laterality: N/A;   Laprascopic assisted subtotal colectomy     LEFT HEART CATH AND CORONARY ANGIOGRAPHY N/A 09/13/2019   Procedure: LEFT HEART CATH AND  CORONARY ANGIOGRAPHY;  Surgeon: Martinique, Peter M, MD;  Location: [facility];  Service: Cardiovascular;  Laterality: N/A;   LITHOTRIPSY  2022   kidney stones   SIGMOIDOSCOPY     UPPER GASTROINTESTINAL ENDOSCOPY     WRIST SURGERY Left 2002   plate  screws    Current Medications: Current Meds  Medication Sig   ALPRAZolam (XANAX) 0.25 MG tablet Take 0.25 mg by mouth 2 (two) times daily as needed for anxiety.   atorvastatin (LIPITOR) 80 MG tablet Take 80 mg by mouth every evening.   celecoxib (CELEBREX) 200 MG capsule Take 200 mg by mouth 2 (two) times daily.   cetirizine (ZYRTEC) 10 MG tablet Take 10 mg by mouth 2 (two) times daily.   clopidogrel (PLAVIX) 75 MG tablet TAKE ONE TABLET BY MOUTH EVERY DAY   glimepiride (AMARYL) 4 MG tablet Take 4 mg by mouth 2 (two) times daily.   metFORMIN (GLUCOPHAGE) 850 MG tablet Take 850 mg by mouth 2 (two) times daily.   nitroGLYCERIN (NITROSTAT) 0.4 MG SL tablet Place 1 tablet (0.4 mg total) under the tongue every 5 (five) minutes as needed for chest pain.   omeprazole (PRILOSEC) 20 MG capsule Take 20 mg by mouth daily.   ranolazine (RANEXA)  500 MG 12 hr tablet Take 1 tablet (500 mg total) by mouth 2 (two) times daily.     Allergies:   Spironolactone and Angiotensin receptor blockers   Social History   Socioeconomic History   Marital status: Married    Spouse name: Not on file   Number of children: Not on file   Years of education: Not on file   Highest education level: Not on file  Occupational History   Not on file  Tobacco Use   Smoking status: Former    Types: Cigarettes    Quit date: 12/05/2017    Years since quitting: 4.6    Passive exposure: Past   Smokeless tobacco: Never  Vaping Use   Vaping Use: Never used  Substance and Sexual Activity   Alcohol use: Yes    Comment: occ   Drug use: Never   Sexual activity: Not on file  Other Topics Concern   Not on file  Social History Narrative   Not on file   Social  Determinants of Health   Financial Resource Strain: Not on file  Food Insecurity: Not on file  Transportation Needs: Not on file  Physical Activity: Not on file  Stress: Not on file  Social Connections: Not on file     Family History: The patient's family history includes Colon polyps in his brother, mother, and sister; Colon polyps (age of onset: 47) in his son; Familial polyposis in his son. There is no history of Colon cancer, Esophageal cancer, Rectal cancer, or Stomach cancer. ROS:   Please see the history of present illness.    All other systems reviewed and are negative.  EKGs/Labs/Other Studies Reviewed:    The following studies were reviewed today:  Left heart catheterization 09/13/2019 Conclusion    Previously placed Prox LAD to Mid LAD stent (unknown type) is widely patent. Previously placed Prox Cx to Dist Cx stent (unknown type) is widely patent. Ramus lesion is 100% stenosed. LPAV lesion is 100% stenosed with 100% stenosed side branch in LPDA. There is moderate left ventricular systolic dysfunction. LV end diastolic pressure is normal. The left ventricular ejection fraction is 35-45% by visual estimate.   1. CAD. Left dominant circulation    - Widely patent stent in the proximal LAD    - Widely patent stent in the mid LCx    - a tiny, wispy intermediate branch is occluded    - 100% proximal left PDA. There are collaterals to this vessel. The vessel is small in caliber 2. Moderate LV dysfunction. EF estimated at 40%.  3. Normal LVEDP  Coronary Diagrams  Diagnostic Dominance: Left  Physical Exam:    VS:  BP 124/72 (BP Location: Left Arm, Patient Position: Sitting)   Pulse 70   Ht '5\' 8"'$  (1.727 m)   Wt 185 lb 6.4 oz (84.1 kg)   SpO2 98%   BMI 28.19 kg/m     Wt Readings from Last 3 Encounters:  08/05/22 185 lb 6.4 oz (84.1 kg)  03/14/22 195 lb (88.5 kg)  02/23/22 195 lb (88.5 kg)     GEN:  Well nourished, well developed in no acute  distress HEENT: Normal NECK: No JVD; No carotid bruits LYMPHATICS: No lymphadenopathy CARDIAC: RRR, no murmurs, rubs, gallops RESPIRATORY:  Clear to auscultation without rales, wheezing or rhonchi  ABDOMEN: Soft, non-tender, non-distended MUSCULOSKELETAL:  No edema; No deformity  SKIN: Warm and dry NEUROLOGIC:  Alert and oriented x 3 PSYCHIATRIC:  Normal affect    Signed,  [name], MD  08/05/2022 8:31 AM    [place]

## 2022-08-05 NOTE — Patient Instructions (Signed)
Medication Instructions:  Your physician recommends that you continue on your current medications as directed. Please refer to the Current Medication list given to you today.  *If you need a refill on your cardiac medications before your next appointment, please call your pharmacy*   Lab Work: None If you have labs (blood work) drawn today and your tests are completely normal, you will receive your results only by: [place] (if you have MyChart) OR A paper copy in the mail If you have any lab test that is abnormal or we need to change your treatment, we will call you to review the results.   Testing/Procedures: Your physician has requested that you have an echocardiogram in 9 months before you see Dr. Bettina Gavia again. Echocardiography is a painless test that uses sound waves to create images of your heart. It provides your doctor with information about the size and shape of your heart and how well your heart's chambers and valves are working. This procedure takes approximately one hour. There are no restrictions for this procedure.    Follow-Up: At [facility], you and your health needs are our priority.  As part of our continuing mission to provide you with exceptional heart care, we have created designated Provider Care Teams.  These Care Teams include your primary Cardiologist (physician) and Advanced Practice Providers (APPs -  Physician Assistants and Nurse Practitioners) who all work together to provide you with the care you need, when you need it.  We recommend signing up for the patient portal called "MyChart".  Sign up information is provided on this After Visit Summary.  MyChart is used to connect with patients for Virtual Visits (Telemedicine).  Patients are able to view lab/test results, encounter notes, upcoming appointments, etc.  Non-urgent messages can be sent to your provider as well.   To learn more about what you can do with MyChart, go to  [email or web address].    Your next appointment:   9 month(s)  The format for your next appointment:   In Person  Provider:   [name], MD    Other Instructions None  Important Information About Sugar

## 2022-12-06 ENCOUNTER — Other Ambulatory Visit: Payer: Self-pay | Admitting: Cardiology

## 2022-12-07 NOTE — Telephone Encounter (Signed)
Clopidogrel 75 mg # 90 x 2 refills sent to   [facility]

## 2023-01-12 ENCOUNTER — Telehealth: Payer: Self-pay | Admitting: *Deleted

## 2023-01-12 ENCOUNTER — Telehealth: Payer: Self-pay

## 2023-01-12 NOTE — Telephone Encounter (Signed)
Pt has been added on tomorrow due to procedure date and med hold. Pt will need to hold Plavix x 7 days see notes from pre op app. Pt will begin to hold Plavix tomorrow. Tele appt 01/13/23 @ 3:20. Pt thanked me for the help.     Patient Consent for Virtual Visit        Larry Warner. has provided verbal consent on 01/12/2023 for a virtual visit (video or telephone).   CONSENT FOR VIRTUAL VISIT FOR:  Larry Warner.  By participating in this virtual visit I agree to the following:  I hereby voluntarily request, consent and authorize [facility] and its employed or contracted physicians, physician assistants, nurse practitioners or other licensed health care professionals (the Practitioner), to provide me with telemedicine health care services (the "Services") as deemed necessary by the treating Practitioner. I acknowledge and consent to receive the Services by the Practitioner via telemedicine. I understand that the telemedicine visit will involve communicating with the Practitioner through live audiovisual communication technology and the disclosure of certain medical information by electronic transmission. I acknowledge that I have been given the opportunity to request an in-person assessment or other available alternative prior to the telemedicine visit and am voluntarily participating in the telemedicine visit.  I understand that I have the right to withhold or withdraw my consent to the use of telemedicine in the course of my care at any time, without affecting my right to future care or treatment, and that the Practitioner or I may terminate the telemedicine visit at any time. I understand that I have the right to inspect all information obtained and/or recorded in the course of the telemedicine visit and may receive copies of available information for a reasonable fee.  I understand that some of the potential risks of receiving the Services via telemedicine include:  Delay or  interruption in medical evaluation due to technological equipment failure or disruption; Information transmitted may not be sufficient (e.g. poor resolution of images) to allow for appropriate medical decision making by the Practitioner; and/or  In rare instances, security protocols could fail, causing a breach of personal health information.  Furthermore, I acknowledge that it is my responsibility to provide information about my medical history, conditions and care that is complete and accurate to the best of my ability. I acknowledge that Practitioner's advice, recommendations, and/or decision may be based on factors not within their control, such as incomplete or inaccurate data provided by me or distortions of diagnostic images or specimens that may result from electronic transmissions. I understand that the practice of medicine is not an exact science and that Practitioner makes no warranties or guarantees regarding treatment outcomes. I acknowledge that a copy of this consent can be made available to me via my patient portal ([place]), or I can request a printed copy by calling the office of [place].    I understand that my insurance will be billed for this visit.   I have read or had this consent read to me. I understand the contents of this consent, which adequately explains the benefits and risks of the Services being provided via telemedicine.  I have been provided ample opportunity to ask questions regarding this consent and the Services and have had my questions answered to my satisfaction. I give my informed consent for the services to be provided through the use of telemedicine in my medical care

## 2023-01-12 NOTE — Telephone Encounter (Signed)
   Pre-operative Risk Assessment    Patient Name: Larry Warner.  DOB: 04/05/1956 MRN: [id number]      Request for Surgical Clearance    Procedure:   Right shoulder arthroscopic rotator cuff repair as other procedures as indicated  Date of Surgery:  Clearance 01/20/23                                 Surgeon:  Joya Salm, MD Surgeon's Group or Practice Name:  [facility] Phone number:  [phone] Fax number:  [phone]   Type of Clearance Requested:   - Pharmacy:  Hold Clopidogrel (Plavix) Plavix   Type of Anesthesia:  General    Additional requests/questions:  Please fax a copy of Cardiology Optimization form  to the surgeon's office.  Signed, Toni Arthurs   01/12/2023, 5:13 PM

## 2023-01-12 NOTE — Telephone Encounter (Signed)
   Name: Larry Warner.  DOB: 1956-05-03  MRN: [id number]  Primary Cardiologist: [name], MD   Preoperative team, please contact this patient and set up a phone call appointment for further preoperative risk assessment. Please obtain consent and complete medication review. Thank you for your help.  I confirm that guidance regarding antiplatelet and oral anticoagulation therapy has been completed and, if necessary, noted below.  Per previous guidance by Dr. Bettina Gavia In the setting of no additional coronary intervention, we will again hold Plavix for 1 week prior to shoulder surgery.      Mable Fill, Marissa Nestle, NP 01/12/2023, 5:32 PM [name]

## 2023-01-12 NOTE — Telephone Encounter (Signed)
Pt has been added on tomorrow due to procedure date and med hold. Pt will need to hold Plavix x 7 days see notes from pre op app. Pt will begin to hold Plavix tomorrow. Tele appt 01/13/23 @ 3:20. Pt thanked me for the help.

## 2023-01-13 ENCOUNTER — Ambulatory Visit: Attending: Cardiology

## 2023-01-13 DIAGNOSIS — Z01810 Encounter for preprocedural cardiovascular examination: Secondary | ICD-10-CM

## 2023-01-13 NOTE — Progress Notes (Signed)
Virtual Visit via Telephone Note   Because of Larry BURKLAND Jr.'s co-morbid illnesses, he is at least at moderate risk for complications without adequate follow up.  This format is felt to be most appropriate for this patient at this time.  The patient did not have access to video technology/had technical difficulties with video requiring transitioning to audio format only (telephone).  All issues noted in this document were discussed and addressed.  No physical exam could be performed with this format.  Please refer to the patient's chart for his consent to telehealth for [facility].  Evaluation Performed:  Preoperative cardiovascular risk assessment _____________   Date:  01/13/2023   Patient ID:  Larry Flavors., DOB Nov 24, 1956, MRN [id number] Patient Location:  Home Provider location:   Office  Primary Care Provider:  [name], MD Primary Cardiologist:  [name], MD  Chief Complaint / Patient Profile   67 y.o. y/o male with a h/o CAD s/p anterior MI 12/2017 with DES to LAD and circumflex, ICM, HTN, HLD, DM type II who is pending right shoulder arthroscopic rotator cuff repair and presents today for telephonic preoperative cardiovascular risk assessment.  History of Present Illness    Larry Trautner. is a 67 y.o. male who presents via audio/video conferencing for a telehealth visit today.  Pt was last seen in cardiology clinic on 08/05/2022 by Dr. Bettina Gavia.  At that time Larry Wellen. was doing well with no anginal complaints.  The patient is now pending procedure as outlined above. Since his last visit, he has not had any cardiac complaints or health concerns since his previous visit.  He has been compliant with his medication regimen and denies any adverse effects.  He denies chest pain, shortness of breath, lower extremity edema, fatigue, palpitations, melena, hematuria, hemoptysis, diaphoresis, weakness, presyncope, syncope, orthopnea, and PND.   We  will again hold Plavix for 1 week prior to shoulder surgery. Will conduct telephone call in order to ensure no intervention outside of our health system.   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  The report date is 12/28/2018.   GERD (gastroesophageal reflux disease)    Heart attack ([place])    History of colon polyps 12/17/2018   Hyperglycemia 01/01/2018   Hyperlipidemia    Jaundice of recent onset 03/14/2018   Left ventricular dysfunction 05/10/2019   Nicotine abuse 01/01/2018   Plantar fasciitis 07/13/2017   ST elevation myocardial infarction involving left anterior descending (LAD) coronary artery ([place]) 01/01/2018   Formatting of this note might be different from the original. Added automatically from request for surgery [id number]   Type 2 diabetes mellitus with circulatory disorder ([place]) 01/30/2018   Unstable angina ([place]) 09/13/2019   Wears glasses    Past Surgical History:  Procedure Laterality Date   Attenuated FAP s/p laproscopic assisted subtotal colectomy with ileo recatal anastomosis     BIOPSY  01/11/2021   Procedure: BIOPSY;  Surgeon: Jackquline Denmark, MD;  Location: [facility];  Service: Endoscopy;;   CARDIAC CATHETERIZATION     2019 stents   COLONOSCOPY     CYSTOSCOPY  2022   removed scaring   ESOPHAGOGASTRODUODENOSCOPY  01/04/2017   Mild gastrtis. Otherwise normal EGD with convetional scope and ERCP scope. No periampullary tumors.    ESOPHAGOGASTRODUODENOSCOPY (EGD) WITH PROPOFOL N/A  01/11/2021   Procedure: ESOPHAGOGASTRODUODENOSCOPY (EGD) WITH PROPOFOL;  Surgeon: Jackquline Denmark, MD;  Location: [facility];  Service: Endoscopy;  Laterality: N/A;  w/ convientional and ERCP scope    FLEXIBLE SIGMOIDOSCOPY N/A 01/11/2021   Procedure: FLEXIBLE SIGMOIDOSCOPY;  Surgeon: Jackquline Denmark, MD;  Location: [facility];  Service: Endoscopy;  Laterality: N/A;   Laprascopic assisted subtotal colectomy     LEFT HEART CATH AND CORONARY ANGIOGRAPHY N/A 09/13/2019   Procedure: LEFT HEART CATH AND CORONARY ANGIOGRAPHY;  Surgeon: Martinique, Peter M, MD;  Location: [facility];  Service: Cardiovascular;  Laterality: N/A;   LITHOTRIPSY  2022   kidney stones   SIGMOIDOSCOPY     UPPER GASTROINTESTINAL ENDOSCOPY     WRIST SURGERY Left 2002   plate  screws    Allergies  Allergies  Allergen Reactions   Spironolactone Other (See Comments)    Mild hyperkalemia   Angiotensin Receptor Blockers Rash    With losartan    Home Medications    Prior to Admission medications   Medication Sig Start Date End Date Taking? Authorizing Provider  ALPRAZolam (XANAX) 0.25 MG tablet Take 0.25 mg by mouth 2 (two) times daily as needed for anxiety. 12/17/19   [provider]  atorvastatin (LIPITOR) 80 MG tablet Take 80 mg by mouth every evening.    [provider]  celecoxib (CELEBREX) 200 MG capsule Take 200 mg by mouth 2 (two) times daily.    [provider]  cetirizine (ZYRTEC) 10 MG tablet Take 10 mg by mouth 2 (two) times daily.    [provider]  clopidogrel (PLAVIX) 75 MG tablet TAKE ONE TABLET BY MOUTH EVERY DAY 12/07/22   [name], MD  glimepiride (AMARYL) 4 MG tablet Take 4 mg by mouth 2 (two) times daily. 12/22/20   [provider]  metFORMIN (GLUCOPHAGE) 850 MG tablet Take 850 mg by mouth 2 (two) times daily.    [provider]  nitroGLYCERIN (NITROSTAT) 0.4 MG SL tablet Place 1 tablet (0.4 mg total) under the tongue every 5 (five)  minutes as needed for chest pain. 02/21/22 02/21/23  [name], MD  omeprazole (PRILOSEC) 20 MG capsule Take 20 mg by mouth daily.    [provider]  ranolazine (RANEXA) 500 MG 12 hr tablet Take 1 tablet (500 mg total) by mouth 2 (two) times daily. 03/01/22   [name], MD  testosterone cypionate (DEPOTESTOSTERONE CYPIONATE) 200 MG/ML injection Inject 200 mg into the muscle every 14 (fourteen) days.    [provider]    Physical Exam    Vital Signs:  Larry Flavors. does not have vital signs available for review today.  Given telephonic nature of communication, physical exam is limited. AAOx3. NAD. Normal affect.  Speech and respirations are unlabored.  Accessory Clinical Findings    None  Assessment & Plan    1.  Preoperative Cardiovascular Risk Assessment: The patient affirms he has been doing well without any new cardiac symptoms. They are able to achieve 5 METS without cardiac limitations. Therefore, based on ACC/AHA guidelines, the patient would be at acceptable risk for the planned  procedure without further cardiovascular testing. The patient was advised that if he develops new symptoms prior to surgery to contact our office to arrange for a follow-up visit, and he verbalized understanding.   Mr. Stretch's perioperative risk of a major cardiac event is 6.6% according to the Revised Cardiac Risk Index (RCRI).  Therefore, he is at high risk for perioperative complications.   His functional capacity is good at 5.29 METs according to the Duke Activity Status Index (DASI). Recommendations: According to ACC/AHA guidelines, no further cardiovascular testing needed.  The patient may proceed to surgery at acceptable risk.   Antiplatelet and/or Anticoagulation Recommendations: Clopidogrel (Plavix) can be held for 7 days prior to his surgery and resumed as soon as possible post op.   The patient was advised that if he develops new symptoms prior to surgery to  contact our office to arrange for a follow-up visit, and he verbalized understanding.  Time:   Today, I have spent 9 minutes with the patient with telehealth technology discussing medical history, symptoms, and management plan.     Mable Fill, Marissa Nestle, NP  01/13/2023, 7:37 AM

## 2023-03-10 ENCOUNTER — Other Ambulatory Visit: Payer: Self-pay | Admitting: Cardiology

## 2023-03-10 NOTE — Telephone Encounter (Signed)
Rx to pharmacy

## 2023-04-28 ENCOUNTER — Other Ambulatory Visit: Payer: Self-pay

## 2023-04-28 MED ORDER — CLOPIDOGREL BISULFATE 75 MG PO TABS: 75.0000 mg | ORAL_TABLET | Freq: Every day | ORAL | 3 refills | Status: AC

## 2023-04-28 MED ORDER — RANOLAZINE ER 500 MG PO TB12: 500.0000 mg | ORAL_TABLET | Freq: Two times a day (BID) | ORAL | 3 refills | Status: DC

## 2023-04-28 MED ORDER — NITROGLYCERIN 0.4 MG SL SUBL: 0.4000 mg | SUBLINGUAL_TABLET | SUBLINGUAL | 3 refills | Status: DC | PRN

## 2023-04-28 NOTE — Telephone Encounter (Signed)
Rx sent to pharmacy   

## 2023-05-03 ENCOUNTER — Telehealth: Payer: Self-pay | Admitting: Cardiology

## 2023-05-03 NOTE — Telephone Encounter (Addendum)
Patient is calling to make sure precert is obtained for his Echo.  He has Garment/textile technologist for [employer].

## 2023-05-08 ENCOUNTER — Ambulatory Visit: Payer: BC Managed Care – PPO | Attending: Cardiology

## 2023-05-08 ENCOUNTER — Other Ambulatory Visit: Payer: Managed Care, Other (non HMO)

## 2023-05-08 ENCOUNTER — Telehealth: Payer: Self-pay

## 2023-05-08 DIAGNOSIS — I255 Ischemic cardiomyopathy: Secondary | ICD-10-CM

## 2023-05-08 DIAGNOSIS — I2510 Atherosclerotic heart disease of native coronary artery without angina pectoris: Secondary | ICD-10-CM | POA: Diagnosis not present

## 2023-05-08 DIAGNOSIS — I10 Essential (primary) hypertension: Secondary | ICD-10-CM | POA: Diagnosis not present

## 2023-05-08 DIAGNOSIS — E782 Mixed hyperlipidemia: Secondary | ICD-10-CM

## 2023-05-08 DIAGNOSIS — E119 Type 2 diabetes mellitus without complications: Secondary | ICD-10-CM

## 2023-05-08 LAB — ECHOCARDIOGRAM COMPLETE
Calc EF: 44.6 %
Est EF: 40
S' Lateral: 4.75 cm
Single Plane A2C EF: 42.1 %
Single Plane A4C EF: 48 %

## 2023-05-08 MED ORDER — PERFLUTREN LIPID MICROSPHERE
1.0000 mL | INTRAVENOUS | Status: AC | PRN
  Administered 2023-05-08: 5 mL via INTRAVENOUS

## 2023-05-08 NOTE — Telephone Encounter (Signed)
-----   Message from Baldo Daub, MD sent at 05/08/2023 12:33 PM EDT ----- Stable result EF remains 40%.

## 2023-05-08 NOTE — Telephone Encounter (Signed)
Patient notified through my chart.

## 2023-06-30 ENCOUNTER — Telehealth: Payer: Self-pay | Admitting: Cardiology

## 2023-06-30 MED ORDER — RANOLAZINE ER 500 MG PO TB12: 500.0000 mg | ORAL_TABLET | Freq: Two times a day (BID) | ORAL | 2 refills | Status: DC

## 2023-06-30 NOTE — Telephone Encounter (Signed)
Pt's medication was sent to pt's pharmacy as requested. Confirmation received.  °

## 2023-06-30 NOTE — Telephone Encounter (Signed)
*  STAT* If patient is at the pharmacy, call can be transferred to refill team.   1. Which medications need to be refilled? (please list name of each medication and dose if known)   ranolazine (RANEXA) 500 MG 12 hr tablet    2. Which pharmacy/location (including street and city if local pharmacy) is medication to be sent to?[facility]   3. Do they need a 30 day or 90 day supply? 90 days

## 2023-08-01 ENCOUNTER — Ambulatory Visit: Payer: BC Managed Care – PPO | Admitting: Cardiology

## 2023-09-04 NOTE — Progress Notes (Unsigned)
Cardiology Office Note:    Date:  09/04/2023   ID:  Larry Warner., DOB 1956/11/12, MRN [id number]  PCP:  Simone Curia, MD  Cardiologist:  Norman Herrlich, MD    Referring MD: Simone Curia, MD    ASSESSMENT:    No diagnosis found. PLAN:    In order of problems listed above:  ***   Next appointment: ***   Medication Adjustments/Labs and Tests Ordered: Current medicines are reviewed at length with the patient today.  Concerns regarding medicines are outlined above.  No orders of the defined types were placed in this encounter.  No orders of the defined types were placed in this encounter.    History of Present Illness:    Larry Warner. is a 67 y.o. male with a hx of CAD with anterior MI PCI and drug-eluting stent to LAD and left circumflex coronary arteries January 2019 LV dysfunction previous ejection fraction 35 to 40% hypertensive heart disease hyperlipidemia and type 2 diabetes.  Last seen 08/05/2022.  Following that visit and echocardiogram performed 05/08/2023 LV dysfunction was seen EF 40% with mild LVH grade 1 diastolic dysfunction mild right ventricular hypokinesia and no valvular abnormality. 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  The report date is 12/28/2018.   GERD (gastroesophageal reflux disease)    Heart attack (H[facility])    History of colon polyps 12/17/2018   Hyperglycemia 01/01/2018   Hyperlipidemia    Jaundice of recent onset  03/14/2018   Left ventricular dysfunction 05/10/2019   Nicotine abuse 01/01/2018   Plantar fasciitis 07/13/2017   ST elevation myocardial infarction involving left anterior descending (LAD) coronary artery ([facility]) 01/01/2018   Formatting of this note might be different from the original. Added automatically from request for surgery [id number]   Type 2 diabetes mellitus with circulatory disorder ([facility]) 01/30/2018   Unstable angina ([facility]) 09/13/2019   Wears glasses     Current Medications: No outpatient medications have been marked as taking for the 09/05/23 encounter (Appointment) with Baldo Daub, MD.      EKGs/Labs/Other Studies Reviewed:    The following studies were reviewed today:  Cardiac Studies & Procedures   CARDIAC CATHETERIZATION  CARDIAC CATHETERIZATION 09/13/2019  Narrative  Previously placed Prox LAD to Mid LAD stent (unknown type) is widely patent.  Previously placed Prox Cx to Dist Cx stent (unknown type) is widely patent.  Ramus lesion is 100% stenosed.  LPAV lesion is 100% stenosed with 100% stenosed side branch in LPDA.  There is moderate left ventricular systolic dysfunction.  LV end diastolic pressure is normal.  The left ventricular ejection fraction is 35-45% by visual estimate.  1. CAD. Left dominant circulation - Widely patent stent in the proximal LAD - Widely patent stent in the mid LCx - a tiny, wispy intermediate branch is occluded - 100% proximal left PDA. There are collaterals to this vessel. The vessel is small in caliber 2. Moderate LV dysfunction. EF estimated at 40%. 3. Normal LVEDP  Plan; recommend medical management. It is unclear from old notes if PDA occlusion is old or more recent. Would need to review films from [facility] to know. At any rate this vessel is too small for PCI and is collateralized.  Findings Coronary Findings Diagnostic  Dominance: Left  Left Main Vessel was injected. Vessel is normal in caliber. Vessel is  angiographically normal.  Left Anterior Descending Previously placed Prox LAD to Mid LAD stent (unknown type) is widely patent.  Ramus Intermedius Vessel is small. Ramus lesion is 100% stenosed.  Left Circumflex Previously placed Prox Cx to Dist Cx stent (unknown type) is widely patent.  Left Posterior Descending Artery Collaterals LPDA filled by collaterals from Dist LAD.  Left Posterior Atrioventricular Artery LPAV lesion is 100% stenosed with 100% stenosed side branch in LPDA.  Right Coronary Artery Vessel was injected. Vessel is normal in caliber. Vessel is angiographically normal.  Intervention  No interventions have been documented.   STRESS TESTS  MYOCARDIAL PERFUSION IMAGING 09/12/2019   ECHOCARDIOGRAM  ECHOCARDIOGRAM COMPLETE 05/08/2023  Narrative ECHOCARDIOGRAM REPORT    Patient Name:   Larry Warner. Date of Exam: 05/08/2023 Medical Rec #:  [id number]            Height:       68.0 in Accession #:    [id number]           Weight:       185.4 lb Date of Birth:  06-09-1956            BSA:          1.979 m Patient Age:    66 years             BP:           124/72 mmHg Patient Gender: M                    HR:           66 bpm. Exam Location:  [facility]  Procedure: 2D Echo, Cardiac Doppler, Color Doppler and Strain Analysis  Indications:    Coronary artery disease involving native coronary artery of native heart without angina pectoris [I25.10 (ICD-10-CM)]; Ischemic cardiomyopathy [I25.5 (ICD-10-CM)]; Essential hypertension [I10 (ICD-10-CM)]; Mixed hyperlipidemia [E78.2 (ICD-10-CM)]; Type 2 diabetes mellitus without complication, without long-term current use of insulin (HCC) [E11.9 (ICD-10-CM)]  History:        Patient has prior history of Echocardiogram examinations, most recent 02/25/2022. Cardiomyopathy, CAD; Risk Factors:Hypertension.  Sonographer:    Louie Boston RDCS Referring Phys: [id number] Bryelle Spiewak J Alitza Cowman  IMPRESSIONS   1. Left ventricular  ejection fraction, by estimation, is 40%%. The left ventricle has normal function. The left ventricle demonstrates regional wall motion abnormalities (see scoring diagram/findings for description). There is mild left ventricular hypertrophy. Left ventricular diastolic parameters are consistent with Grade I diastolic dysfunction (impaired relaxation). The average left ventricular global longitudinal strain is -12.6 %. The global longitudinal strain is abnormal. 2. Right ventricular systolic function is mildly reduced. The right ventricular size is normal. There is normal pulmonary artery systolic pressure. 3. The mitral valve is degenerative. No evidence of mitral valve regurgitation. No evidence of mitral stenosis. 4. The aortic valve is tricuspid. Aortic valve regurgitation is not visualized. No aortic stenosis is present. 5. Aortic Normal DTA. 6. The inferior vena cava is normal in size with greater than 50% respiratory variability, suggesting right atrial pressure of 3 mmHg.  FINDINGS Left Ventricle: Left ventricular ejection fraction, by  estimation, is 40%%. The left ventricle has normal function. The left ventricle demonstrates regional wall motion abnormalities. Definity contrast agent was given IV to delineate the left ventricular endocardial borders. The average left ventricular global longitudinal strain is -12.6 %. The global longitudinal strain is abnormal. The left ventricular internal cavity size was normal in size. There is mild left ventricular hypertrophy. Left ventricular diastolic parameters are consistent with Grade I diastolic dysfunction (impaired relaxation). Indeterminate filling pressures.   LV Wall Scoring: The mid anteroseptal segment and apex are akinetic. The inferior septum, basal anteroseptal segment, apical anterior segment, and apical inferior segment are hypokinetic. The anterior wall, entire lateral wall, and inferior wall are normal.  Right Ventricle: The right  ventricular size is normal. No increase in right ventricular wall thickness. Right ventricular systolic function is mildly reduced. There is normal pulmonary artery systolic pressure. The tricuspid regurgitant velocity is 1.13 m/s, and with an assumed right atrial pressure of 3 mmHg, the estimated right ventricular systolic pressure is 8.1 mmHg.  Left Atrium: Left atrial size was normal in size.  Right Atrium: Right atrial size was normal in size.  Pericardium: There is no evidence of pericardial effusion.  Mitral Valve: The mitral valve is degenerative in appearance. Mild mitral annular calcification. No evidence of mitral valve regurgitation. No evidence of mitral valve stenosis.  Tricuspid Valve: The tricuspid valve is normal in structure. Tricuspid valve regurgitation is not demonstrated. No evidence of tricuspid stenosis.  Aortic Valve: The aortic valve is tricuspid. Aortic valve regurgitation is not visualized. No aortic stenosis is present.  Pulmonic Valve: The pulmonic valve was normal in structure. Pulmonic valve regurgitation is not visualized. No evidence of pulmonic stenosis.  Aorta: Normal DTA, the aortic arch was not well visualized and the aortic root and ascending aorta are structurally normal, with no evidence of dilitation.  Venous: The pulmonary veins were not well visualized. The inferior vena cava is normal in size with greater than 50% respiratory variability, suggesting right atrial pressure of 3 mmHg.  IAS/Shunts: No atrial level shunt detected by color flow Doppler.   LEFT VENTRICLE PLAX 2D LVIDd:         5.30 cm      Diastology LVIDs:         4.75 cm      LV e' medial:    5.11 cm/s LV PW:         1.20 cm      LV E/e' medial:  18.5 LV IVS:        1.20 cm      LV e' lateral:   5.55 cm/s LVOT diam:     2.00 cm      LV E/e' lateral: 17.0 LV SV:         52 LV SV Index:   26           2D Longitudinal Strain LVOT Area:     3.14 cm     2D Strain GLS Avg:     -12.6  %  LV Volumes (MOD) LV vol d, MOD A2C: 86.5 ml LV vol d, MOD A4C: 108.0 ml LV vol s, MOD A2C: 50.1 ml LV vol s, MOD A4C: 56.2 ml LV SV MOD A2C:     36.4 ml LV SV MOD A4C:     108.0 ml LV SV MOD BP:      43.5 ml  RIGHT VENTRICLE            IVC RV Basal diam:  3.00 cm    IVC diam: 1.40 cm RV S prime:     9.90 cm/s TAPSE (M-mode): 1.6 cm  LEFT ATRIUM           Index        RIGHT ATRIUM           Index LA diam:      3.80 cm 1.92 cm/m   RA Area:     15.50 cm LA Vol (A4C): 40.1 ml 20.26 ml/m  RA Volume:   37.50 ml  18.95 ml/m AORTIC VALVE LVOT Vmax:   72.70 cm/s LVOT Vmean:  48.725 cm/s LVOT VTI:    0.164 m  AORTA Ao Root diam: 3.20 cm Ao Asc diam:  3.40 cm Ao Desc diam: 2.10 cm  MV E velocity: 94.30 cm/s   TRICUSPID VALVE MV A velocity: 111.00 cm/s  TR Peak grad:   5.1 mmHg MV E/A ratio:  0.85         TR Vmax:        113.00 cm/s  SHUNTS Systemic VTI:  0.16 m Systemic Diam: 2.00 cm  Norman Herrlich MD Electronically signed by Norman Herrlich MD Signature Date/Time: 05/08/2023/12:03:58 PM    Final    MONITORS  LONG TERM MONITOR (3-14 DAYS) 11/25/2019  Narrative A ZIO monitor was performed for 5 days and 5 hours beginning 11/07/2019 to assess for arrhythmia associated with CAD and reduced ejection fraction 34% by gated pool 35 to 40% by echocardiogram.  The cardiac rhythm throughout was sinus with minimum average and maximum heart rates of 52, 77 and 119 bpm.  There were no episodes of atrial fibrillation or flutter.  There were no pauses of 3 seconds or greater.  Ventricular ectopy was rare with isolated PVCs.  Supraventricular ectopy was rare with isolated APCs.  There were no diary events.  There were 3 triggered events 1 associated with a single atrial premature contraction.   Conclusion: Unremarkable ZIO monitor with no evidence of complex arrhythmia in the setting of CAD and reduced ejection fraction.               Recent Labs: No results found for  requested labs within last 365 days.  Recent Lipid Panel No results found for: "CHOL", "TRIG", "HDL", "CHOLHDL", "VLDL", "LDLCALC", "LDLDIRECT"  Physical Exam:    VS:  There were no vitals taken for this visit.    Wt Readings from Last 3 Encounters:  08/05/22 185 lb 6.4 oz (84.1 kg)  03/14/22 195 lb (88.5 kg)  02/23/22 195 lb (88.5 kg)     GEN: *** Well nourished, well developed in no acute distress HEENT: Normal NECK: No JVD; No carotid bruits LYMPHATICS: No lymphadenopathy CARDIAC: ***RRR, no murmurs, rubs, gallops RESPIRATORY:  Clear to auscultation without rales, wheezing or rhonchi  ABDOMEN: Soft, non-tender, non-distended MUSCULOSKELETAL:  No edema; No deformity  SKIN: Warm and dry NEUROLOGIC:  Alert and oriented x 3 PSYCHIATRIC:  Normal affect    Signed, Norman Herrlich, MD  09/04/2023 1:44 PM    [facility]

## 2023-09-05 ENCOUNTER — Encounter: Payer: Self-pay | Admitting: Cardiology

## 2023-09-05 ENCOUNTER — Ambulatory Visit: Payer: Medicare Other | Attending: Cardiology | Admitting: Cardiology

## 2023-09-05 VITALS — BP 118/80 | HR 83 | Ht 68.0 in | Wt 183.4 lb

## 2023-09-05 DIAGNOSIS — I255 Ischemic cardiomyopathy: Secondary | ICD-10-CM

## 2023-09-05 DIAGNOSIS — E119 Type 2 diabetes mellitus without complications: Secondary | ICD-10-CM

## 2023-09-05 DIAGNOSIS — E782 Mixed hyperlipidemia: Secondary | ICD-10-CM | POA: Diagnosis not present

## 2023-09-05 DIAGNOSIS — I2510 Atherosclerotic heart disease of native coronary artery without angina pectoris: Secondary | ICD-10-CM | POA: Diagnosis not present

## 2023-09-05 DIAGNOSIS — I119 Hypertensive heart disease without heart failure: Secondary | ICD-10-CM

## 2023-09-05 MED ORDER — ATORVASTATIN CALCIUM 40 MG PO TABS: 40.0000 mg | ORAL_TABLET | Freq: Every day | ORAL | 3 refills | Status: DC

## 2023-09-05 MED ORDER — EZETIMIBE 10 MG PO TABS: 10.0000 mg | ORAL_TABLET | Freq: Every day | ORAL | 3 refills | Status: DC

## 2023-09-05 NOTE — Patient Instructions (Signed)
Medication Instructions:  Your physician has recommended you make the following change in your medication:   START: Zetia 10 mg daily START: Atorvastatin 40 mg daily  *If you need a refill on your cardiac medications before your next appointment, please call your pharmacy*   Lab Work: None If you have labs (blood work) drawn today and your tests are completely normal, you will receive your results only by: MyChart Message (if you have MyChart) OR A paper copy in the mail If you have any lab test that is abnormal or we need to change your treatment, we will call you to review the results.   Testing/Procedures: None   Follow-Up: At [facility], you and your health needs are our priority.  As part of our continuing mission to provide you with exceptional heart care, we have created designated Provider Care Teams.  These Care Teams include your primary Cardiologist (physician) and Advanced Practice Providers (APPs -  Physician Assistants and Nurse Practitioners) who all work together to provide you with the care you need, when you need it.  We recommend signing up for the patient portal called "MyChart".  Sign up information is provided on this After Visit Summary.  MyChart is used to connect with patients for Virtual Visits (Telemedicine).  Patients are able to view lab/test results, encounter notes, upcoming appointments, etc.  Non-urgent messages can be sent to your provider as well.   To learn more about what you can do with MyChart, go to [email or web address].    Your next appointment:   6 month(s)  Provider:   Norman Herrlich, MD    Other Instructions None

## 2024-01-09 ENCOUNTER — Telehealth: Payer: Self-pay | Admitting: Gastroenterology

## 2024-01-09 NOTE — Telephone Encounter (Signed)
Inbound call from patient in regards to recall for flex sig and possible colonoscopy? Please advise.   Thank you

## 2024-01-10 NOTE — Telephone Encounter (Signed)
 Pt questioned when he needed the flex sig and EGD. Pt was notified that both could be scheduled soon. Pt did state that he was on plavix .  Pt was scheduled for an office visit on 03/07/2024 at 9:30 PM to see Dr. Charlanne. Pt made aware.  Pt verbalized understanding with all questions answered.

## 2024-02-28 ENCOUNTER — Encounter: Payer: Self-pay | Admitting: Cardiology

## 2024-03-04 ENCOUNTER — Ambulatory Visit: Payer: Medicare Other | Admitting: Cardiology

## 2024-03-04 ENCOUNTER — Ambulatory Visit: Attending: Cardiology | Admitting: Cardiology

## 2024-03-04 ENCOUNTER — Encounter: Payer: Self-pay | Admitting: Cardiology

## 2024-03-04 VITALS — BP 114/72 | HR 56 | Ht 68.0 in | Wt 193.4 lb

## 2024-03-04 DIAGNOSIS — I10 Essential (primary) hypertension: Secondary | ICD-10-CM | POA: Diagnosis not present

## 2024-03-04 DIAGNOSIS — E782 Mixed hyperlipidemia: Secondary | ICD-10-CM

## 2024-03-04 DIAGNOSIS — I25118 Atherosclerotic heart disease of native coronary artery with other forms of angina pectoris: Secondary | ICD-10-CM | POA: Diagnosis not present

## 2024-03-04 MED ORDER — RANOLAZINE ER 500 MG PO TB12: 500.0000 mg | ORAL_TABLET | Freq: Two times a day (BID) | ORAL | 2 refills | Status: DC

## 2024-03-04 MED ORDER — ATORVASTATIN CALCIUM 80 MG PO TABS
80.0000 mg | ORAL_TABLET | Freq: Every day | ORAL | 3 refills | Status: AC
Start: 2024-03-04 — End: 2024-09-09

## 2024-03-04 MED ORDER — EZETIMIBE 10 MG PO TABS: 10.0000 mg | ORAL_TABLET | Freq: Every day | ORAL | 3 refills | Status: DC

## 2024-03-04 NOTE — Patient Instructions (Signed)
 Medication Instructions:   INCREASE: Atorvastatin to 80mg  daily   Lab Work: Your physician recommends that you return for lab work in: 6 weeks You need to have labs done when you are fasting.  You can come Monday through Friday 8:30 am to 12:00 pm and 1:15 to 4:30. You do not need to make an appointment as the order has already been placed. The labs you are going to have done are AST, ALT Lipids.    Testing/Procedures: None Ordered   Follow-Up: At [facility], you and your health needs are our priority.  As part of our continuing mission to provide you with exceptional heart care, we have created designated Provider Care Teams.  These Care Teams include your primary Cardiologist (physician) and Advanced Practice Providers (APPs -  Physician Assistants and Nurse Practitioners) who all work together to provide you with the care you need, when you need it.  We recommend signing up for the patient portal called "MyChart".  Sign up information is provided on this After Visit Summary.  MyChart is used to connect with patients for Virtual Visits (Telemedicine).  Patients are able to view lab/test results, encounter notes, upcoming appointments, etc.  Non-urgent messages can be sent to your provider as well.   To learn more about what you can do with MyChart, go to [email or web address].    Your next appointment:   6 month(s)  The format for your next appointment:   In Person  Provider:   Gypsy Balsam, MD    Other Instructions NA

## 2024-03-04 NOTE — Progress Notes (Signed)
 Cardiology Office Note:    Date:  03/04/2024   ID:  Larry Mantis., DOB 03-31-1956, MRN [id number]  PCP:  Simone Curia, MD  Cardiologist:  Gypsy Balsam, MD    Referring MD: Simone Curia, MD   Chief Complaint  Patient presents with   Medication Refill    History of Present Illness:    Larry Lebeau. is a 68 y.o. male history significant for coronary disease he did have 1 drug-eluting stent to the circumflex artery in January 2019, at that time ejection fraction 35-40, after that he maintained ejection fraction in the neighborhood of 40%, also hypertensive heart disease, dyslipidemia, type 2 diabetes.  Comes today to months for follow-up.  He is doing well.  He denies of any chest pain tightness squeezing pressure burning chest no palpitations dizziness swelling of lower extremities.  He gained 10 pounds since last visit and will be disappointed about that.  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  The report date is 12/28/2018.   GERD (gastroesophageal reflux disease)    Heart attack (HCC)    History of colon polyps 12/17/2018   Hyperglycemia 01/01/2018   Hyperlipidemia    Jaundice of recent onset 03/14/2018   Left ventricular dysfunction 05/10/2019   Nicotine abuse 01/01/2018   Plantar fasciitis 07/13/2017   ST elevation myocardial infarction involving left anterior descending (LAD) coronary artery (HCC) 01/01/2018    Formatting of this note might be different from the original. 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 Otherwise normal EGD with convetional scope and ERCP scope. No periampullary tumors.    ESOPHAGOGASTRODUODENOSCOPY (EGD) WITH PROPOFOL N/A 01/11/2021   Procedure: ESOPHAGOGASTRODUODENOSCOPY (EGD) WITH PROPOFOL;  Surgeon: Lynann Bologna, MD;  Location: [facility];  Service: Endoscopy;  Laterality: N/A;  w/ convientional and ERCP scope    FLEXIBLE SIGMOIDOSCOPY N/A 01/11/2021   Procedure: FLEXIBLE SIGMOIDOSCOPY;  Surgeon: Lynann Bologna, MD;  Location: [facility];  Service: Endoscopy;  Laterality: N/A;   Laprascopic assisted subtotal colectomy     LEFT HEART CATH AND CORONARY ANGIOGRAPHY N/A 09/13/2019   Procedure: LEFT HEART CATH AND CORONARY ANGIOGRAPHY;  Surgeon: Swaziland, Peter M, MD;  Location: [facility] INVASIVE CV LAB;  Service: Cardiovascular;  Laterality: N/A;   LITHOTRIPSY  2022   kidney stones   SIGMOIDOSCOPY     UPPER GASTROINTESTINAL ENDOSCOPY     WRIST  SURGERY Left 2002   plate  screws    Current Medications: Current Meds  Medication Sig   ALPRAZolam (XANAX) 0.25 MG tablet Take 0.25 mg by mouth 2 (two) times daily as needed for anxiety.   atorvastatin (LIPITOR) 40 MG tablet Take 1 tablet (40 mg total) by mouth daily.   celecoxib (CELEBREX) 200 MG capsule Take 200 mg by mouth 2 (two) times daily.   cetirizine (ZYRTEC) 10 MG tablet  Take 10 mg by mouth 2 (two) times daily.   clopidogrel (PLAVIX) 75 MG tablet Take 1 tablet (75 mg total) by mouth daily.   ezetimibe (ZETIA) 10 MG tablet Take 1 tablet (10 mg total) by mouth daily.   glimepiride (AMARYL) 4 MG tablet Take 4 mg by mouth 2 (two) times daily.   metFORMIN (GLUCOPHAGE) 850 MG tablet Take 850 mg by mouth 2 (two) times daily.   nitroGLYCERIN (NITROSTAT) 0.4 MG SL tablet Place 1 tablet (0.4 mg total) under the tongue every 5 (five) minutes as needed for chest pain.   omeprazole (PRILOSEC) 20 MG capsule Take 20 mg by mouth daily.   ranolazine (RANEXA) 500 MG 12 hr tablet Take 1 tablet (500 mg total) by mouth 2 (two) times daily.   tadalafil (CIALIS) 20 MG tablet Take 20 mg by mouth daily as needed for erectile dysfunction.   testosterone cypionate (DEPOTESTOSTERONE CYPIONATE) 200 MG/ML injection Inject 200 mg into the muscle every 14 (fourteen) days.     Allergies:   Spironolactone and Angiotensin receptor blockers   Social History   Socioeconomic History   Marital status: Married    Spouse name: Not on file   Number of children: Not on file   Years of education: Not on file   Highest education level: Not on file  Occupational History   Not on file  Tobacco Use   Smoking status: Former    Current packs/day: 0.00    Types: Cigarettes    Quit date: 12/05/2017    Years since quitting: 6.2    Passive exposure: Past   Smokeless tobacco: Never  Vaping Use   Vaping status: Never Used  Substance and Sexual Activity   Alcohol use: Yes    Comment: occ   Drug use: Never   Sexual activity: Not on file  Other Topics Concern   Not on file  Social History Narrative   Not on file   Social Drivers of Health   Financial Resource Strain: Not on file  Food Insecurity: Not on file  Transportation Needs: Not on file  Physical Activity: Not on file  Stress: Not on file  Social Connections: Not on file     Family History: The patient's family history includes  Colon polyps in his brother, mother, and sister; Colon polyps (age of onset: 74) in his son; Familial polyposis in his son. There is no history of Colon cancer, Esophageal cancer, Rectal cancer, or Stomach cancer. ROS:   Please see the history of present illness.    All 14 point review of systems negative except as described per history of present illness  EKGs/Labs/Other Studies Reviewed:         Recent Labs: No results found for requested labs within last 365 days.  Recent Lipid Panel No results found for: "CHOL", "TRIG", "HDL", "CHOLHDL", "VLDL", "LDLCALC", "LDLDIRECT"  Physical Exam:    VS:  BP 114/72 (BP Location: Right Arm, Patient Position: Sitting)   Pulse (!) 56   Ht 5\' 8"  (1.727 m)   Wt  193 lb 6.4 oz (87.7 kg)   SpO2 95%   BMI 29.41 kg/m     Wt Readings from Last 3 Encounters:  03/04/24 193 lb 6.4 oz (87.7 kg)  09/05/23 183 lb 6.4 oz (83.2 kg)  08/05/22 185 lb 6.4 oz (84.1 kg)     GEN:  Well nourished, well developed in no acute distress HEENT: Normal NECK: No JVD; No carotid bruits LYMPHATICS: No lymphadenopathy CARDIAC: RRR, no murmurs, no rubs, no gallops RESPIRATORY:  Clear to auscultation without rales, wheezing or rhonchi  ABDOMEN: Soft, non-tender, non-distended MUSCULOSKELETAL:  No edema; No deformity  SKIN: Warm and dry LOWER EXTREMITIES: no swelling NEUROLOGIC:  Alert and oriented x 3 PSYCHIATRIC:  Normal affect   ASSESSMENT:    1. Coronary artery disease of native artery of native heart with stable angina pectoris (HCC)   2. Essential hypertension   3. Mixed hyperlipidemia    PLAN:    In order of problems listed above:  Coronary disease stable from that point review continue antiplatelets therapy in form of Plavix.  He denies have any signs and symptoms of worsening of the condition no chest pain tightness squeezing pressure burning chest. Dyslipidemia I had a long discussion with him his LDL 65 HDL 38.  I would like to see his LDL below 55  with his advanced disease and.  Also his diabetes not well-controlled tomorrow particulates, so it supposed to be.  He agreed to increase dose of atorvastatin to 80 mg daily.  Continue with Zetia 6 weeks later will check his fasting profile. Diabetes hemoglobin A1c 6.3 we had a long discussion about management.  She was 6.3 was in 2023 I called primary care physician office I was able to obtain his more after today's hemoglobin A1c sadly it is higher at 7.5 and definitely more cardioprotective diabetes under control.  I strongly recommended to exercise least 5 times a week at least 30 minutes moderate intensity exercise he hopefully will be able to comply with that. Essential hypertension blood pressure well-controlled.  Continue present management Cardiomyopathy ejection fraction 40% is in origin, will try to put him on small dose of ARB.  I did review the chart apparently he did develop some rash with this class of medication therefore will not initiate   Medication Adjustments/Labs and Tests Ordered: Current medicines are reviewed at length with the patient today.  Concerns regarding medicines are outlined above.  No orders of the defined types were placed in this encounter.  Medication changes: No orders of the defined types were placed in this encounter.   Signed, Larry Lea, MD, [facility] 03/04/2024 8:38 AM    [facility]

## 2024-03-04 NOTE — Addendum Note (Signed)
 Addended by: Baldo Ash D on: 03/04/2024 08:51 AM   Modules accepted: Orders

## 2024-03-07 ENCOUNTER — Ambulatory Visit: Payer: Medicare Other | Admitting: Gastroenterology

## 2024-03-07 ENCOUNTER — Telehealth: Payer: Self-pay

## 2024-03-07 ENCOUNTER — Encounter: Payer: Self-pay | Admitting: Gastroenterology

## 2024-03-07 VITALS — BP 114/60 | HR 80 | Ht 66.0 in | Wt 192.1 lb

## 2024-03-07 DIAGNOSIS — R1310 Dysphagia, unspecified: Secondary | ICD-10-CM | POA: Diagnosis not present

## 2024-03-07 DIAGNOSIS — Z8372 Family history of familial adenomatous polyposis: Secondary | ICD-10-CM

## 2024-03-07 DIAGNOSIS — K219 Gastro-esophageal reflux disease without esophagitis: Secondary | ICD-10-CM

## 2024-03-07 MED ORDER — HYDROCORTISONE (PERIANAL) 2.5 % EX CREA: 1.0000 | TOPICAL_CREAM | Freq: Two times a day (BID) | CUTANEOUS | 1 refills | Status: DC

## 2024-03-07 NOTE — Telephone Encounter (Signed)
 Pt on plavix monotherapy with prior PCI in the LAD and LCX. LAD stent 3.5 x 38 mm in the mid vessel in 2019 [facility])  We are asked for plavix hold for upcoming GI procedure. Given his stent length, this falls outside of our preop algorithm. Can you please make recommendations on plavix hold for EGD and c-scope?

## 2024-03-07 NOTE — Progress Notes (Signed)
 Chief Complaint: FU  Referring Provider:  Simone Curia, MD      ASSESSMENT AND PLAN;   #1. 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 [place]). Positive for single, heterozygous pathogenic gene APC mutation (K.[id number]>H) 2019.  #2. GERD with dysphagia  #2.  Comorbid conditions CAD s/p DES 12/2017 (on plavix), HTN, HLD, LVD (EF 40%), DM2.  Plan: -Ba Swallow with tab. -FS/EGD with conventional and ERCP scope at [facility]. Off plavix 5 days, after Dr cardio clearence from Dr Kirtland Bouchard. -Continue omeprazole 20mg  po QD -HC 2.5% cream BID x 10 days. 2RF -Continue Celebrex 200 mg p.o. twice daily.  HPI:    Larry Warner. is a 68 y.o. male  With H/O CAD (s/p anterior STEMI with PCI/DES to LAD and LCx 12/2017), ICM (EF 45-50% 05/2019), HTN, HLD, DM2 For follow-up visit.  Discussed the use of AI scribe software for clinical note transcription with the patient, who gave verbal consent to proceed.  History of Present Illness Larry Warner. is a 68 year old male with heart disease who presents with dysphagia and hemorrhoids.  He has been experiencing episodes of dysphagia over the past two to three months, primarily when consuming liquids. The sensation is described as 'going down the wrong pipe,' leading to frequent coughing fits, especially when drinking coffee. These episodes occur intermittently and are not associated with solids. No heartburn, nausea, or vomiting.  Over the past three to four months, there has been an increase in hemorrhoid symptoms, with more frequent bleeding requiring the use of ointment and wearing a pad to prevent staining his underwear. He describes the hemorrhoids as feeling more external and has been using a prescription hemorrhoid cream.  He has a history of heart disease with a current ejection fraction of 40%, which has decreased from 45% post-heart attack. He is currently on atorvastatin, which was recently increased from 40 mg to 80 mg  to manage his A1c levels. He is also taking Plavix and Celebrex twice daily, which he has been on for several years without stomach issues. He takes omeprazole 20 mg daily for stomach protection.    Wt Readings from Last 3 Encounters:  03/07/24 192 lb 2 oz (87.1 kg)  03/04/24 193 lb 6.4 oz (87.7 kg)  09/05/23 183 lb 6.4 oz (83.2 kg)     Past GI procedures: -Had several flexible sigmoidoscopies since surgery.  Was initially having it every 6 months.  Last FS 03/2022: Small external and internal hemorrhoids, otherwise normal to anastomosis. -Several EGDs.  Last EGD 01/04/2017 (EGD scope and ERCP scope)-mild gastritis.  Otherwise normal.  No periampullary tumors. 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  The report date is 12/28/2018.   GERD (gastroesophageal reflux disease)    Heart attack (HCC)    History of colon polyps 12/17/2018   Hyperglycemia 01/01/2018   Hyperlipidemia    Jaundice of recent onset 03/14/2018   Left  ventricular dysfunction 05/10/2019   Nicotine abuse 01/01/2018   Plantar fasciitis 07/13/2017   ST elevation myocardial infarction involving left anterior descending (LAD) coronary artery (HCC) 01/01/2018   Formatting of this note might be different from the original. Added automatically from request for surgery [id number]   Type 2 diabetes mellitus with circulatory disorder (HCC) 01/30/2018   Unstable angina (HCC)  09/13/2019   Wears glasses     Past Surgical History:  Procedure Laterality Date   Attenuated FAP s/p laproscopic assisted subtotal colectomy with ileo recatal anastomosis     BIOPSY  01/11/2021   Procedure: BIOPSY;  Surgeon: Lynann Bologna, MD;  [facility]: [facility];  Service: Endoscopy;;   CARDIAC CATHETERIZATION     2019 stents   COLONOSCOPY     CYSTOSCOPY  2022   removed scaring   ESOPHAGOGASTRODUODENOSCOPY  01/04/2017   Mild gastrtis. Otherwise normal EGD with convetional scope and ERCP scope. No periampullary tumors.    ESOPHAGOGASTRODUODENOSCOPY (EGD) WITH PROPOFOL N/A 01/11/2021   Procedure: ESOPHAGOGASTRODUODENOSCOPY (EGD) WITH PROPOFOL;  Surgeon: Lynann Bologna, MD;  [facility];  Service: Endoscopy;  Laterality: N/A;  w/ convientional and ERCP scope    FLEXIBLE SIGMOIDOSCOPY N/A 01/11/2021   Procedure: FLEXIBLE SIGMOIDOSCOPY;  Surgeon: Lynann Bologna, MD;  [facility];  Service: Endoscopy;  Laterality: N/A;   Laprascopic assisted subtotal colectomy     LEFT HEART CATH AND CORONARY ANGIOGRAPHY N/A 09/13/2019   Procedure: LEFT HEART CATH AND CORONARY ANGIOGRAPHY;  Surgeon: Swazil[place], Peter M, MD;  [facility] INVASIVE CV LAB;  Service: Cardiovascular;  Laterality: N/A;   LITHOTRIPSY  2022   kidney stones   ROTATOR CUFF REPAIR Right    (x 2) 06/2022, 01/2023   SIGMOIDOSCOPY     UPPER GASTROINTESTINAL ENDOSCOPY     WRIST SURGERY Left 2002   plate  screws    Family History  Problem Relation Age of Onset   Colon polyps Mother        <12 polyps   Colon polyps Sister    Colon polyps Brother        had colectomy due to polyp count   Colon polyps Son 33       total colectomy   Familial polyposis Son        APC mutation   Colon cancer Neg Hx    Esophageal cancer Neg Hx    Rectal cancer Neg Hx    Stomach cancer Neg Hx     Social History   Tobacco Use   Smoking status: Former    Current packs/day: 0.00    Types: Cigarettes    Quit date: 12/05/2017     Years since quitting: 6.2    Passive exposure: Past   Smokeless tobacco: Never  Vaping Use   Vaping status: Never Used  Substance Use Topics   Alcohol use: Yes    Comment: occ   Drug use: Never    Current Outpatient Medications  Medication Sig Dispense Refill   ALPRAZolam (XANAX) 0.25 MG tablet Take 0.25 mg by mouth 2 (two) times daily as needed for anxiety.     atorvastatin (LIPITOR) 80 MG tablet Take 1 tablet (80 mg total) by mouth daily. 90 tablet 3   celecoxib (CELEBREX) 200 MG capsule Take 200 mg by mouth 2 (two) times daily.     cetirizine (ZYRTEC) 10 MG tablet Take 10 mg by mouth 2 (two) times daily.     clopidogrel (PLAVIX) 75 MG tablet Take 1 tablet (75 mg total) by  mouth daily. 90 tablet 3   ezetimibe (ZETIA) 10 MG tablet Take 1 tablet (10 mg total) by mouth daily. 90 tablet 3   glimepiride (AMARYL) 4 MG tablet Take 4 mg by mouth 2 (two) times daily.     metFORMIN (GLUCOPHAGE) 850 MG tablet Take 850 mg by mouth 2 (two) times daily.     nitroGLYCERIN (NITROSTAT) 0.4 MG SL tablet Place 1 tablet (0.4 mg total) under the tongue every 5 (five) minutes as needed for chest pain. 25 tablet 3   omeprazole (PRILOSEC) 20 MG capsule Take 20 mg by mouth daily.     ranolazine (RANEXA) 500 MG 12 hr tablet Take 1 tablet (500 mg total) by mouth 2 (two) times daily. 180 tablet 2   tadalafil (CIALIS) 20 MG tablet Take 20 mg by mouth daily as needed for erectile dysfunction.     testosterone cypionate (DEPOTESTOSTERONE CYPIONATE) 200 MG/ML injection Inject 200 mg into the muscle every 14 (fourteen) days.     No current facility-administered medications for this visit.    Allergies  Allergen Reactions   Spironolactone Other (See Comments)    Mild hyperkalemia   Angiotensin Receptor Blockers Rash    With losartan    Review of Systems:  neg     Physical Exam:    BP 114/60 (BP Location: Left Arm, Patient Position: Sitting, Cuff Size: Normal)   Pulse 80   Ht 5\' 6"  (1.676 m)  Comment: height measured without shoes  Wt 192 lb 2 oz (87.1 kg)   BMI 31.01 kg/m  Filed Weights   03/07/24 0843  Weight: 192 lb 2 oz (87.1 kg)  Gen: awake, alert, NAD HEENT: anicteric, no pallor CV: RRR, no mrg Pulm: CTA b/l Abd: soft, NT/ND, +BS throughout Ext: no c/c/e Neuro: nonfocal    Edman Circle, MD 03/07/2024, 8:56 AM  Cc: Simone Curia, MD

## 2024-03-07 NOTE — Patient Instructions (Addendum)
 _______________________________________________________  If your blood pressure at your visit was 140/90 or greater, please contact your primary care physician to follow up on this.  _______________________________________________________  If you are age 66 or older, your body mass index should be between 23-30. Your Body mass index is 31.01 kg/m. If this is out of the aforementioned range listed, please consider follow up with your Primary Care Provider.  If you are age 12 or younger, your body mass index should be between 19-25. Your Body mass index is 31.01 kg/m. If this is out of the aformentioned range listed, please consider follow up with your Primary Care Provider.   ________________________________________________________  The [facility] providers would like to encourage you to use [facility] to communicate with providers for non-urgent requests or questions.  Due to long hold times on the telephone, sending your provider a message by [facility] may be a faster and more efficient way to get a response.  Please allow 48 business hours for a response.  Please remember that this is for non-urgent requests.  _______________________________________________________  Bonita Quin will be contacted by our office prior to your procedure for directions on holding your Plavix.  If you do not hear from our office 2 week prior to your scheduled procedure, please call [phone] to discuss.   We have sent the following medications to your pharmacy for you to pick up at your convenience: Hydrocortizone cream  You have been scheduled for a Barium Esophogram at [facility] (1st floor of the hospital) on 03-19-24 at 9am. Please arrive 15 minutes prior to your appointment for registration. Make certain not to have anything to eat or drink 3 hours prior to your test. If you need to reschedule for any reason, please contact radiology at [phone] to do  so. __________________________________________________________________ A barium swallow is an examination that concentrates on views of the esophagus. This tends to be a double contrast exam (barium and two liquids which, when combined, create a gas to distend the wall of the oesophagus) or single contrast (non-ionic iodine based). The study is usually tailored to your symptoms so a good history is essential. Attention is paid during the study to the form, structure and configuration of the esophagus, looking for functional disorders (such as aspiration, dysphagia, achalasia, motility and reflux) EXAMINATION You may be asked to change into a gown, depending on the type of swallow being performed. A radiologist and radiographer will perform the procedure. The radiologist will advise you of the type of contrast selected for your procedure and direct you during the exam. You will be asked to stand, sit or lie in several different positions and to hold a small amount of fluid in your mouth before being asked to swallow while the imaging is performed .In some instances you may be asked to swallow barium coated marshmallows to assess the motility of a solid food bolus. The exam can be recorded as a digital or video fluoroscopy procedure. POST PROCEDURE It will take 1-2 days for the barium to pass through your system. To facilitate this, it is important, unless otherwise directed, to increase your fluids for the next 24-48hrs and to resume your normal diet.  This test typically takes about 30 minutes to perform. __________________________________________________________________________________  Bonita Quin have been scheduled for an endoscopy and flexible sigmoidoscopy. Case number [id number]. Please follow the written instructions given to you at your visit today. If you use inhalers (even only as needed), please bring them with you on the day of your procedure.  Thank you,  Dr. Lynann Bologna

## 2024-03-07 NOTE — Telephone Encounter (Signed)
   Name: Larry Warner.  DOB: Jul 03, 1956  MRN: [id number]   Primary Cardiologist: Norman Herrlich, MD  Chart reviewed as part of pre-operative protocol coverage.   We have been asked for guidance to hold plavix for upcoming procedure.   Pt on plavix monotherapy with prior PCI in the LAD and LCX. LAD stent 3.5 x 38 mm in the mid vessel in 2019 [facility])   Given stent length, per Dr. Dulce Sellar: If the gastroenterologist thinks that procedure cannot be done with Plavix on board it is okay to hold unwillingly    If plavix held, our protocol recommends taking ASA. ASA can be stopped once plavix reinstated.  I will route this recommendation to the requesting party via Epic fax function and remove from pre-op pool. Please call with questions.  Marcelino Duster, PA 03/07/2024, 3:39 PM

## 2024-03-07 NOTE — Telephone Encounter (Signed)
 [facility] Pre-operative Risk Assessment     Request for surgical clearance:     Endoscopy Procedure  What type of surgery is being performed?     EGD/Flex  When is this surgery scheduled?     05-07-24  What type of clearance is required ?   Pharmacy  Are there any medications that need to be held prior to surgery and how long? Plavix 5 day hold  Practice name and name of physician performing surgery?      [facility]  What is your office phone and fax number?      Phone- [phone]  Fax- [phone]  Anesthesia type (None, local, MAC, general) ?       MAC   Please route your response to [facility])

## 2024-03-07 NOTE — Telephone Encounter (Signed)
 Patient will hold blood thinner prior to the procedure and do baby aspirin. Will call patient tomorrow to let him know

## 2024-03-08 NOTE — Telephone Encounter (Signed)
 Patient made aware and mychart message

## 2024-03-19 ENCOUNTER — Ambulatory Visit (HOSPITAL_COMMUNITY)
Admission: RE | Admit: 2024-03-19 | Discharge: 2024-03-19 | Disposition: A | Discharge status: Home / Self Care | Source: Ambulatory Visit | Attending: Gastroenterology | Admitting: Gastroenterology

## 2024-03-19 DIAGNOSIS — K219 Gastro-esophageal reflux disease without esophagitis: Secondary | ICD-10-CM | POA: Diagnosis present

## 2024-03-19 DIAGNOSIS — R1310 Dysphagia, unspecified: Secondary | ICD-10-CM | POA: Insufficient documentation

## 2024-04-16 LAB — LIPID PANEL
Chol/HDL Ratio: 3 ratio (ref 0.0–5.0)
Cholesterol, Total: 98 mg/dL — ABNORMAL LOW (ref 100–199)
HDL: 33 mg/dL — ABNORMAL LOW (ref >39)
LDL Chol Calc (NIH): 49 mg/dL (ref 0–99)
Triglycerides: 76 mg/dL (ref 0–149)
VLDL Cholesterol Cal: 16 mg/dL (ref 5–40)

## 2024-04-16 LAB — AST: AST: 19 IU/L (ref 0–40)

## 2024-04-16 LAB — ALT: ALT: 18 IU/L (ref 0–44)

## 2024-04-24 ENCOUNTER — Ambulatory Visit: Payer: Self-pay | Admitting: Cardiology

## 2024-04-30 ENCOUNTER — Telehealth: Payer: Self-pay

## 2024-04-30 NOTE — Telephone Encounter (Signed)
 Procedure:EGD Procedure date: 05/07/24 Procedure location: [facility] Arrival Time:7:30 Spoke with the patient Y/N: Y Any prep concerns? N  Has the patient obtained the prep from the pharmacy ? N Do you have a care partner and transportation: Y Any additional concerns? N

## 2024-05-02 ENCOUNTER — Encounter (HOSPITAL_COMMUNITY): Payer: Self-pay | Admitting: Gastroenterology

## 2024-05-02 NOTE — Progress Notes (Signed)
 Attempted to obtain medical history for pre op call via telephone, unable to reach at this time. HIPAA compliant voicemail message left requesting return call to pre surgical testing department.

## 2024-05-06 NOTE — Anesthesia Preprocedure Evaluation (Addendum)
 Anesthesia Evaluation  Patient identified by MRN, date of birth, ID band Patient awake    Reviewed: Allergy & Precautions, NPO status , Patient's Chart, lab work & pertinent test results  History of Anesthesia Complications Negative for: history of anesthetic complications  Airway Mallampati: III  TM Distance: >3 FB Neck ROM: Full    Dental  (+) Dental Advisory Given,    Pulmonary neg shortness of breath, neg sleep apnea, neg COPD, neg recent URI, Patient abstained from smoking., former smoker   Pulmonary exam normal breath sounds clear to auscultation       Cardiovascular hypertension, + angina (no nitroglycerin  needed in the last year)  + CAD, + Past MI, + Cardiac Stents (DES 12/2017) and +CHF (EF 40%)  (-) CABG + dysrhythmias (incomplete RBBB)  Rhythm:Regular Rate:Normal  HLD  TTE 05/08/2023: IMPRESSIONS    1. Left ventricular ejection fraction, by estimation, is 40%. The left  ventricle has normal function. The left ventricle demonstrates regional  wall motion abnormalities (see scoring diagram/findings for description).  There is mild left ventricular  hypertrophy. Left ventricular diastolic parameters are consistent with  Grade I diastolic dysfunction (impaired relaxation). The average left  ventricular global longitudinal strain is -12.6 %. The global longitudinal  strain is abnormal.   2. Right ventricular systolic function is mildly reduced. The right  ventricular size is normal. There is normal pulmonary artery systolic  pressure.   3. The mitral valve is degenerative. No evidence of mitral valve  regurgitation. No evidence of mitral stenosis.   4. The aortic valve is tricuspid. Aortic valve regurgitation is not  visualized. No aortic stenosis is present.   5. Aortic Normal DTA.   6. The inferior vena cava is normal in size with greater than 50%  respiratory variability, suggesting right atrial pressure of 3 mmHg.    LHC 09/13/2019: 1. CAD. Left dominant circulation    - Widely patent stent in the proximal LAD    - Widely patent stent in the mid LCx    - a tiny, wispy intermediate branch is occluded    - 100% proximal left PDA. There are collaterals to this vessel. The vessel is small in caliber 2. Moderate LV dysfunction. EF estimated at 40%.  3. Normal LVEDP     Neuro/Psych neg Seizures negative neurological ROS     GI/Hepatic Neg liver ROS,GERD  Medicated,,FAP   Endo/Other  diabetes, Type 2, Oral Hypoglycemic Agents    Renal/GU negative Renal ROS     Musculoskeletal   Abdominal   Peds  Hematology negative hematology ROS (+)   Anesthesia Other Findings Last Plavix : 04/28/2024  Reproductive/Obstetrics                             Anesthesia Physical Anesthesia Plan  ASA: 3  Anesthesia Plan: MAC   Post-op Pain Management:    Induction: Intravenous  PONV Risk Score and Plan: 1 and Propofol  infusion, TIVA and Treatment may vary due to age or medical condition  Airway Management Planned: Natural Airway and Simple Face Mask  Additional Equipment:   Intra-op Plan:   Post-operative Plan:   Informed Consent: I have reviewed the patients History and Physical, chart, labs and discussed the procedure including the risks, benefits and alternatives for the proposed anesthesia with the patient or authorized representative who has indicated his/her understanding and acceptance.     Dental advisory given  Plan Discussed with: CRNA and Anesthesiologist  Anesthesia  Plan Comments: (Discussed with patient risks of MAC including, but not limited to, minor pain or discomfort, hearing people in the room, and possible need for backup general anesthesia. Risks for general anesthesia also discussed including, but not limited to, sore throat, hoarse voice, chipped/damaged teeth, injury to vocal cords, nausea and vomiting, allergic reactions, lung infection, heart  attack, stroke, and death. All questions answered. )       Anesthesia Quick Evaluation

## 2024-05-07 ENCOUNTER — Ambulatory Visit (HOSPITAL_COMMUNITY): Admitting: Anesthesiology

## 2024-05-07 ENCOUNTER — Encounter (HOSPITAL_COMMUNITY)
Admission: RE | Disposition: A | Discharge status: Home / Self Care | Payer: Self-pay | Source: Home / Self Care | Attending: Gastroenterology

## 2024-05-07 ENCOUNTER — Ambulatory Visit (HOSPITAL_COMMUNITY)
Admission: RE | Admit: 2024-05-07 | Discharge: 2024-05-07 | Disposition: A | Discharge status: Home / Self Care | Attending: Gastroenterology | Admitting: Gastroenterology

## 2024-05-07 ENCOUNTER — Encounter (HOSPITAL_COMMUNITY): Payer: Self-pay | Admitting: Gastroenterology

## 2024-05-07 ENCOUNTER — Other Ambulatory Visit: Payer: Self-pay

## 2024-05-07 DIAGNOSIS — Z955 Presence of coronary angioplasty implant and graft: Secondary | ICD-10-CM | POA: Diagnosis not present

## 2024-05-07 DIAGNOSIS — Z1211 Encounter for screening for malignant neoplasm of colon: Secondary | ICD-10-CM | POA: Insufficient documentation

## 2024-05-07 DIAGNOSIS — Z87891 Personal history of nicotine dependence: Secondary | ICD-10-CM | POA: Insufficient documentation

## 2024-05-07 DIAGNOSIS — K644 Residual hemorrhoidal skin tags: Secondary | ICD-10-CM | POA: Diagnosis not present

## 2024-05-07 DIAGNOSIS — Z7902 Long term (current) use of antithrombotics/antiplatelets: Secondary | ICD-10-CM | POA: Diagnosis not present

## 2024-05-07 DIAGNOSIS — I25110 Atherosclerotic heart disease of native coronary artery with unstable angina pectoris: Secondary | ICD-10-CM | POA: Diagnosis not present

## 2024-05-07 DIAGNOSIS — K219 Gastro-esophageal reflux disease without esophagitis: Secondary | ICD-10-CM | POA: Diagnosis not present

## 2024-05-07 DIAGNOSIS — Z791 Long term (current) use of non-steroidal anti-inflammatories (NSAID): Secondary | ICD-10-CM | POA: Insufficient documentation

## 2024-05-07 DIAGNOSIS — Z9049 Acquired absence of other specified parts of digestive tract: Secondary | ICD-10-CM | POA: Diagnosis not present

## 2024-05-07 DIAGNOSIS — E1159 Type 2 diabetes mellitus with other circulatory complications: Secondary | ICD-10-CM | POA: Diagnosis not present

## 2024-05-07 DIAGNOSIS — I252 Old myocardial infarction: Secondary | ICD-10-CM | POA: Diagnosis not present

## 2024-05-07 DIAGNOSIS — Z79899 Other long term (current) drug therapy: Secondary | ICD-10-CM | POA: Insufficient documentation

## 2024-05-07 DIAGNOSIS — K649 Unspecified hemorrhoids: Secondary | ICD-10-CM

## 2024-05-07 DIAGNOSIS — E785 Hyperlipidemia, unspecified: Secondary | ICD-10-CM | POA: Diagnosis not present

## 2024-05-07 DIAGNOSIS — I10 Essential (primary) hypertension: Secondary | ICD-10-CM | POA: Diagnosis not present

## 2024-05-07 DIAGNOSIS — I2510 Atherosclerotic heart disease of native coronary artery without angina pectoris: Secondary | ICD-10-CM | POA: Diagnosis not present

## 2024-05-07 DIAGNOSIS — Z7984 Long term (current) use of oral hypoglycemic drugs: Secondary | ICD-10-CM | POA: Insufficient documentation

## 2024-05-07 DIAGNOSIS — R1310 Dysphagia, unspecified: Secondary | ICD-10-CM | POA: Insufficient documentation

## 2024-05-07 DIAGNOSIS — I2102 ST elevation (STEMI) myocardial infarction involving left anterior descending coronary artery: Secondary | ICD-10-CM

## 2024-05-07 DIAGNOSIS — K648 Other hemorrhoids: Secondary | ICD-10-CM | POA: Diagnosis not present

## 2024-05-07 DIAGNOSIS — D126 Benign neoplasm of colon, unspecified: Secondary | ICD-10-CM | POA: Insufficient documentation

## 2024-05-07 DIAGNOSIS — D1339 Benign neoplasm of other parts of small intestine: Secondary | ICD-10-CM

## 2024-05-07 DIAGNOSIS — R1319 Other dysphagia: Secondary | ICD-10-CM

## 2024-05-07 DIAGNOSIS — K6389 Other specified diseases of intestine: Secondary | ICD-10-CM

## 2024-05-07 HISTORY — DX: Other dysphagia: R13.19

## 2024-05-07 HISTORY — PX: SAVORY DILATION: SHX5439

## 2024-05-07 HISTORY — PX: FLEXIBLE SIGMOIDOSCOPY: SHX5431

## 2024-05-07 HISTORY — DX: Other specified diseases of intestine: K63.89

## 2024-05-07 LAB — GLUCOSE, CAPILLARY: Glucose-Capillary: 168 mg/dL — ABNORMAL HIGH (ref 70–99)

## 2024-05-07 SURGERY — EGD, WITH DILATION USING SAVARY-GILLIARD DILATOR OVER GUIDEWIRE
Anesthesia: Monitor Anesthesia Care

## 2024-05-07 MED ORDER — SODIUM CHLORIDE 0.9 % IV SOLN
INTRAVENOUS | Status: DC | PRN
  Administered 2024-05-07: 500 mL via INTRAVENOUS

## 2024-05-07 MED ORDER — PHENYLEPHRINE 80 MCG/ML (10ML) SYRINGE FOR IV PUSH (FOR BLOOD PRESSURE SUPPORT)
PREFILLED_SYRINGE | INTRAVENOUS | Status: DC | PRN
  Administered 2024-05-07 (×2): 80 ug via INTRAVENOUS

## 2024-05-07 MED ORDER — LACTATED RINGERS IV SOLN: INTRAVENOUS | Status: DC | PRN

## 2024-05-07 MED ORDER — PROPOFOL 500 MG/50ML IV EMUL
INTRAVENOUS | Status: DC | PRN
  Administered 2024-05-07: 220 ug/kg/min via INTRAVENOUS

## 2024-05-07 MED ORDER — PROPOFOL 10 MG/ML IV BOLUS
INTRAVENOUS | Status: AC
Start: 2024-05-07 — End: ?
  Filled 2024-05-07: qty 20

## 2024-05-07 NOTE — Op Note (Signed)
 [facility] Patient Name: Larry Warner Procedure Date: 05/07/2024 MRN: [id number] Attending MD: Lajuan Pila , MD, [id number] Date of Birth: 12/12/1955 CSN: [id number] Age: 68 Admit Type: Outpatient Procedure:                Upper GI endoscopy Indications:              1. Dysphagia with negative barium swallow. 2. 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 Waters). 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 Providers:                Lajuan Pila, MD, Jacquelyn "Jaci" Bernetta Brilliant, RN,                            Adolfo Hooker, Technician Referring MD:              Medicines:                Monitored Anesthesia Care Complications:            No immediate complications. Estimated Blood Loss:     Estimated blood loss: none. Procedure:                Pre-Anesthesia Assessment:                           - Prior to the procedure, a History and Physical                            was performed, and patient medications and                            allergies were reviewed. The patient's tolerance of                            previous anesthesia was also reviewed. The risks                            and benefits of the procedure and the sedation                            options and risks were discussed with the patient.                            All questions were answered, and informed consent                            was obtained.  Prior Anticoagulants: The patient has                            taken Plavix  (clopidogrel ), last dose was 5 days                            prior to procedure. ASA Grade Assessment: II - A                            patient with mild systemic disease. After reviewing                            the risks and benefits, the patient was deemed in                             satisfactory condition to undergo the procedure.                           After obtaining informed consent, the endoscope was                            passed under direct vision. Throughout the                            procedure, the patient's blood pressure, pulse, and                            oxygen saturations were monitored continuously. The                            [id number]-H190 ([id number]) Olympus endoscope was introduced                            through the mouth, and advanced to the second part                            of duodenum. The [id number]-[id number] ([id number]) Olympus                            duodenoscope was introduced through the mouth, and                            advanced to the second part of duodenum. The upper                            GI endoscopy was accomplished without difficulty.                            The patient tolerated the procedure well. Scope In: Scope Out: Findings:      The examined esophagus was normal. Biopsies were obtained from the       proximal and distal esophagus with cold forceps for histology of       suspected eosinophilic esophagitis. The scope was withdrawn.  Dilation       was performed with a Maloney dilator with mild resistance at 52 Fr.      The Z-line was regular and was found 38 cm from the incisors.      The entire examined stomach was normal.      The examined duodenum was normal.      Thereafter, ERCP scope was utilized and major papilla and second portion       of the duodenum examined again. Impression:               - Normal EGD                           - S/P esophageal dilatation.                           - Normal major papilla on ERCP scope. Moderate Sedation:      Not Applicable - Patient had care per Anesthesia. Recommendation:           - Patient has a contact number available for                            emergencies. The signs and symptoms of potential                            delayed complications were  discussed with the                            patient. Return to normal activities tomorrow.                            Written discharge instructions were provided to the                            patient.                           - Resume previous diet.                           - Continue present medications.                           - Await pathology results.                           - Repeat in 3 years.                           - The findings and recommendations were discussed                            with the patient's family. Procedure Code(s):        --- Professional ---                           [phone], Esophagogastroduodenoscopy, flexible,  transoral; with biopsy, single or multiple                           43450, Dilation of esophagus, by unguided sound or                            bougie, single or multiple passes Diagnosis Code(s):        --- Professional ---                           R13.10, Dysphagia, unspecified CPT copyright 2022 American Medical Association. All rights reserved. The codes documented in this report are preliminary and upon coder review may  be revised to meet current compliance requirements. Lajuan Pila, MD 05/07/2024 9:30:12 AM This report has been signed electronically. Number of Addenda: 0

## 2024-05-07 NOTE — Discharge Instructions (Signed)

## 2024-05-07 NOTE — Transfer of Care (Signed)
 Immediate Anesthesia Transfer of Care Note  Patient: Larry Warner.  Procedure(s) Performed: EGD, WITH DILATION USING SAVARY-GILLIARD DILATOR OVER GUIDEWIRE SIGMOIDOSCOPY, FLEXIBLE  Patient Location: PACU and Endoscopy Unit  Anesthesia Type:General  Level of Consciousness: awake, alert , oriented, and patient cooperative  Airway & Oxygen Therapy: Patient Spontanous Breathing and Patient connected to face mask oxygen  Post-op Assessment: Report given to RN and Post -op Vital signs reviewed and stable  Post vital signs: Reviewed and stable  Last Vitals:  Vitals Value Taken Time  BP 81/37 05/07/24 0932  Temp    Pulse 59 05/07/24 0934  Resp 14 05/07/24 0934  SpO2 94 % 05/07/24 0934  Vitals shown include unfiled device data.  Last Pain:  Vitals:   05/07/24 0749  TempSrc: Temporal  PainSc: 0-No pain         Complications: No notable events documented.

## 2024-05-07 NOTE — H&P (Signed)
 Chief Complaint: FU   Referring Provider:  Angelique Barer, MD        ASSESSMENT AND PLAN;    #1. 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 Waters). 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   #2. GERD with dysphagia   #2.  Comorbid conditions CAD s/p DES 12/2017 (on plavix ), HTN, HLD, LVD (EF 40%), DM2.   Plan: -Ba Swallow with tab. -FS/EGD with conventional and ERCP scope at [facility]. Off plavix  5 days, after Dr cardio clearence from Dr Linnell Richardson. -Continue omeprazole 20mg  po QD -HC 2.5% cream BID x 10 days. 2RF -Continue Celebrex 200 mg p.o. twice daily.   HPI:     Larry Warner. is a 68 y.o. male  With H/O CAD (s/p anterior STEMI with PCI/DES to LAD and LCx 12/2017), ICM (EF 45-50% 05/2019), HTN, HLD, DM2 For follow-up visit.   Discussed the use of AI scribe software for clinical note transcription with the patient, who gave verbal consent to proceed.   History of Present Illness Larry Warner. is a 68 year old male with heart disease who presents with dysphagia and hemorrhoids.   He has been experiencing episodes of dysphagia over the past two to three months, primarily when consuming liquids. The sensation is described as 'going down the wrong pipe,' leading to frequent coughing fits, especially when drinking coffee. These episodes occur intermittently and are not associated with solids. No heartburn, nausea, or vomiting.   Over the past three to four months, there has been an increase in hemorrhoid symptoms, with more frequent bleeding requiring the use of ointment and wearing a pad to prevent staining his underwear. He describes the hemorrhoids as feeling more external and has been using a prescription hemorrhoid cream.   He has a history of heart disease with a current ejection fraction of 40%, which has decreased from 45% post-heart attack. He is currently on atorvastatin , which was recently increased from  40 mg to 80 mg to manage his A1c levels. He is also taking Plavix  and Celebrex twice daily, which he has been on for several years without stomach issues. He takes omeprazole 20 mg daily for stomach protection.          Wt Readings from Last 3 Encounters:  03/07/24 192 lb 2 oz (87.1 kg)  03/04/24 193 lb 6.4 oz (87.7 kg)  09/05/23 183 lb 6.4 oz (83.2 kg)        Past GI procedures: -Had several flexible sigmoidoscopies since surgery.  Was initially having it every 6 months.  Last FS 03/2022: Small external and internal hemorrhoids, otherwise normal to anastomosis. -Several EGDs.  Last EGD 01/04/2017 (EGD scope and ERCP scope)-mild gastritis.  Otherwise normal.  No periampullary tumors.     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  The report date is 12/28/2018.  GERD (gastroesophageal reflux disease)     Heart attack (HCC)     History of colon polyps 12/17/2018   Hyperglycemia 01/01/2018   Hyperlipidemia     Jaundice of recent onset 03/14/2018   Left ventricular dysfunction 05/10/2019   Nicotine abuse 01/01/2018   Plantar fasciitis 07/13/2017   ST elevation myocardial infarction involving left anterior descending (LAD) coronary artery (HCC) 01/01/2018    Formatting of this note might be different from the original. Added automatically from request for surgery [id number]   Type 2 diabetes mellitus with circulatory disorder (HCC)  01/30/2018   Unstable angina (HCC) 09/13/2019   Wears glasses                 Past Surgical History:  Procedure Laterality Date   Attenuated FAP s/p laproscopic assisted subtotal colectomy with ileo recatal anastomosis       BIOPSY   01/11/2021    Procedure: BIOPSY;  Surgeon: Lajuan Pila, MD;  Location: [facility] ENDOSCOPY;  Service: Endoscopy;;   CARDIAC CATHETERIZATION        2019 stents   COLONOSCOPY       CYSTOSCOPY   2022    removed scaring   ESOPHAGOGASTRODUODENOSCOPY   01/04/2017    Mild gastrtis. Otherwise normal EGD with convetional scope and ERCP scope. No periampullary tumors.    ESOPHAGOGASTRODUODENOSCOPY (EGD) WITH PROPOFOL  N/A 01/11/2021    Procedure: ESOPHAGOGASTRODUODENOSCOPY (EGD) WITH PROPOFOL ;  Surgeon: Lajuan Pila, MD;  Location: [facility] ENDOSCOPY;  Service: Endoscopy;  Laterality: N/A;  w/ convientional and ERCP scope    FLEXIBLE SIGMOIDOSCOPY N/A 01/11/2021    Procedure: FLEXIBLE SIGMOIDOSCOPY;  Surgeon: Lajuan Pila, MD;  Location: [facility] END[facility]COPY;  Service: Endoscopy;  Laterality: N/A;   Laprascopic assisted subtotal colectomy       LEFT HEART CATH AND CORONARY ANGIOGRAPHY N/A 09/13/2019    Procedure: LEFT HEART CATH AND CORONARY ANGIOGRAPHY;  Surgeon: [place], Peter M, MD;  Location: [facility] INVASIVE CV LAB;  Service: Cardiovascular;  Laterality: N/A;   LITHOTRIPSY   2022    kidney stones   ROTATOR CUFF REPAIR Right      (x 2) 06/2022, 01/2023   SIGMOIDOSCOPY       UPPER GASTROINTESTINAL ENDOSCOPY       WRIST SURGERY Left 2002    plate  screws               Family History  Problem Relation Age of Onset   Colon polyps Mother          <12 polyps   Colon polyps Sister     Colon polyps Brother          had colectomy due to polyp count   Colon polyps Son 33        total colectomy   Familial polyposis Son          APC mutation   Colon cancer Neg Hx     Esophageal cancer Neg Hx     Rectal cancer Neg Hx     Stomach cancer Neg Hx            Social History   Social History         Tobacco Use   Smoking status: Former      Current packs/day: 0.00      Types: Cigarettes      Quit date: 12/05/2017      Years since quitting: 6.2      Passive exposure: Past   Smokeless tobacco: Never  Vaping Use  Vaping status: Never Used  Substance Use Topics   Alcohol use: Yes      Comment: occ   Drug use: Never              Current Outpatient Medications  Medication Sig Dispense Refill   ALPRAZolam (XANAX) 0.25 MG tablet Take 0.25 mg by mouth 2 (two) times daily as needed for anxiety.       atorvastatin  (LIPITOR) 80 MG tablet Take 1 tablet (80 mg total) by mouth daily. 90 tablet 3   celecoxib (CELEBREX) 200 MG capsule Take 200 mg by mouth 2 (two) times daily.       cetirizine (ZYRTEC) 10 MG tablet Take 10 mg by mouth 2 (two) times daily.       clopidogrel  (PLAVIX ) 75 MG tablet Take 1 tablet (75 mg total) by mouth daily. 90 tablet 3   ezetimibe  (ZETIA ) 10 MG tablet Take 1 tablet (10 mg total) by mouth daily. 90 tablet 3   glimepiride (AMARYL) 4 MG tablet Take 4 mg by mouth 2 (two) times daily.       metFORMIN (GLUCOPHAGE) 850 MG tablet Take 850 mg by mouth 2 (two) times daily.       nitroGLYCERIN  (NITROSTAT ) 0.4 MG SL tablet Place 1 tablet (0.4 mg total) under the tongue every 5 (five) minutes as needed for chest pain. 25 tablet 3   omeprazole (PRILOSEC) 20 MG capsule Take 20 mg by mouth daily.       ranolazine  (RANEXA ) 500 MG 12 hr tablet Take 1 tablet (500 mg total) by mouth 2 (two) times daily. 180 tablet 2   tadalafil (CIALIS) 20 MG tablet Take 20 mg by mouth daily as needed for erectile dysfunction.       testosterone cypionate (DEPOTESTOSTERONE CYPIONATE) 200 MG/ML injection Inject 200 mg into the muscle every 14 (fourteen) days.          No current facility-administered medications for this visit.        Allergies       Allergies  Allergen Reactions   Spironolactone  Other (See Comments)      Mild hyperkalemia   Angiotensin Receptor  Blockers Rash      With losartan        Review of Systems:  neg       Physical Exam:     BP 114/60 (BP Location: Left Arm, Patient Position: Sitting, Cuff Size: Normal)   Pulse 80   Ht 5\' 6"  (1.676 m) Comment: height measured without shoes  Wt 192 lb 2 oz (87.1 kg)   BMI 31.01 kg/m     Filed Weights    03/07/24 0843  Weight: 192 lb 2 oz (87.1 kg)  Gen: awake, alert, NAD HEENT: anicteric, no pallor CV: RRR, no mrg Pulm: CTA b/l Abd: soft, NT/ND, +BS throughout Ext: no c/c/e Neuro: nonfocal       Larry Schuller, MD    Attending physician's note   I have taken history, reviewed the chart and examined the patient.  No change Neg Ba Swallow  For EGD/FS as above   Larry Schuller, MD [facility] [phone]

## 2024-05-07 NOTE — Op Note (Addendum)
 [facility] Patient Name: Larry Warner Procedure Date: 05/07/2024 MRN: [id number] Attending MD: Lajuan Pila , MD, [id number] Date of Birth: [birth year] CSN: [id number] Age: 68 Admit Type: Outpatient Procedure:                Flexible Sigmoidoscopy Indications:              High risk colon cancer surveillance: H/O FAP s/p                            subtotal colectomy. Providers:                Lajuan Pila, MD, Jacquelyn "Jaci" Bernetta Brilliant, RN,                            Adolfo Hooker, Technician Referring MD:              Medicines:                Propofol  per Anesthesia Complications:            No immediate complications. Estimated Blood Loss:     Estimated blood loss: none. Procedure:                Pre-Anesthesia Assessment:                           - Prior to the procedure, a History and Physical                            was performed, and patient medications and                            allergies were reviewed. The patient's tolerance of                            previous anesthesia was also reviewed. The risks                            and benefits of the procedure and the sedation                            options and risks were discussed with the patient.                            All questions were answered, and informed consent                            was obtained. Prior Anticoagulants: The patient has                            taken Plavix  (clopidogrel ), last dose was 5 days                            prior to procedure. ASA Grade Assessment: II - A  patient with mild systemic disease. After reviewing                            the risks and benefits, the patient was deemed in                            satisfactory condition to undergo the procedure.                           After obtaining informed consent, the scope was                            passed under direct vision. The [id number]-HQ190L                             ([id number]) Olympus colonoscope was introduced                            through the anus and advanced to the the                            ileo-sigmoid anastomosis. The flexible                            sigmoidoscopy was accomplished without difficulty.                            The patient tolerated the procedure well. The                            quality of the bowel preparation was good. Scope In: 9:15:06 AM Scope Out: 9:27:04 AM Total Procedure Duration: 0 hours 11 minutes 58 seconds  Findings:      Hemorrhoids and healed posterior anal fissure were found on perianal       exam.      A 5 mm polyp was found in the neo-TI, 5 cm from anastomosis. The polyp       was sessile. The polyp was removed with a cold snare followed by cold       biopsy forceps. Resection and retrieval were complete.      The exam was otherwise without abnormality. The colon was normal without       any polyps. Impression:               - One 5 mm polyp in neo-TI, 5 cm proximal to                            anastomosis, removed with a cold snare followed by                            biopsy forceps. Resected and retrieved.                           - The examination was otherwise normal. Moderate Sedation:      Not Applicable - Patient had care per Anesthesia. Recommendation:           -  Discharge patient to home.                           - Await pathology results.                           - Rpt flexible sigmoidoscopy in 1 year.                           - Resume Plavix  6/5                           - The findings and recommendations were discussed                            with the patient's family. Procedure Code(s):        --- Professional ---                           [phone], Sigmoidoscopy, flexible; with removal of                            tumor(s), polyp(s), or other lesion(s) by snare                            technique Diagnosis Code(s):        --- Professional ---                            Z86.010, Personal history of colonic polyps                           D12.6, Benign neoplasm of colon, unspecified                           K64.9, Unspecified hemorrhoids CPT copyright 2022 American [employer] Association. All rights reserved. The codes documented in this report are preliminary and upon coder review may  be revised to meet current compliance requirements. Lajuan Pila, MD 05/07/2024 9:42:11 AM This report has been signed electronically. Number of Addenda: 0

## 2024-05-07 NOTE — Anesthesia Procedure Notes (Signed)
 Procedure Name: General with mask airway Date/Time: 05/07/2024 8:47 AM  Performed by: Carolynn Citrin, CRNAPre-anesthesia Checklist: Patient identified, Emergency Drugs available, Timeout performed and Patient being monitored Patient Re-evaluated:Patient Re-evaluated prior to induction Oxygen Delivery Method: Simple face mask Preoxygenation: Pre-oxygenation with 100% oxygen Induction Type: IV induction Placement Confirmation: positive ETCO2 Dental Injury: Teeth and Oropharynx as per pre-operative assessment  Comments: POM mask used

## 2024-05-07 NOTE — Anesthesia Postprocedure Evaluation (Signed)
 Anesthesia Post Note  Patient: Larry Warner.  Procedure(s) Performed: EGD, WITH DILATION USING SAVARY-GILLIARD DILATOR OVER GUIDEWIRE SIGMOIDOSCOPY, FLEXIBLE     Patient location during evaluation: PACU Anesthesia Type: MAC Level of consciousness: awake Pain management: pain level controlled Vital Signs Assessment: post-procedure vital signs reviewed and stable Respiratory status: spontaneous breathing, nonlabored ventilation and respiratory function stable Cardiovascular status: stable and blood pressure returned to baseline Postop Assessment: no apparent nausea or vomiting Anesthetic complications: no   No notable events documented.  Last Vitals:  Vitals:   05/07/24 0940 05/07/24 0950  BP: 102/66 108/69  Pulse: 66 60  Resp: 13 18  Temp:    SpO2: 96% 95%    Last Pain:  Vitals:   05/07/24 0950  TempSrc:   PainSc: 0-No pain                 Conard Decent

## 2024-05-08 LAB — SURGICAL PATHOLOGY

## 2024-05-09 ENCOUNTER — Encounter (HOSPITAL_COMMUNITY): Payer: Self-pay | Admitting: Gastroenterology

## 2024-05-14 ENCOUNTER — Ambulatory Visit: Payer: Self-pay | Admitting: Gastroenterology

## 2024-07-18 ENCOUNTER — Other Ambulatory Visit: Payer: Self-pay | Admitting: Cardiology

## 2024-07-20 ENCOUNTER — Other Ambulatory Visit: Payer: Self-pay | Admitting: Cardiology

## 2024-09-08 NOTE — Progress Notes (Unsigned)
 Cardiology Office Note:    Date:  09/09/2024   ID:  Larry Warner [name], DOB [birth year], MRN [id number]  PCP:  Jama Chow, MD  Cardiologist:  Redell Leiter, MD    Referring MD: Jama Chow, MD    ASSESSMENT:    1. Coronary artery disease of native artery of native heart with stable angina pectoris   2. Ischemic cardiomyopathy   3. Hypertensive heart disease without heart failure   4. Essential hypertension   5. Mixed hyperlipidemia   6. Type 2 diabetes mellitus without complication, without long-term current use of insulin (HCC)    PLAN:    In order of problems listed above:  He continues to do well with CAD asymptomatic [place]  Heart Association class I continue medical therapy with long-term clopidogrel  ranolazine  and his current high intensity statin plus Zetia  November check labs including an ApoB Hypertension well-controlled I am going to recheck an echocardiogram if the EF remains reduced we will need to embrace guideline therapy SGLT2 inhibitor spironolactone  and depending on EF and Entresto Stable diabetes well-controlled   Next appointment: I will plan to see him in 1 year   Medication Adjustments/Labs and Tests Ordered: Current medicines are reviewed at length with the patient today.  Concerns regarding medicines are outlined above.  Orders Placed This Encounter  Procedures   EKG 12-Lead   Meds ordered this encounter  Medications   metFORMIN (GLUCOPHAGE) 850 MG tablet    Sig: Take 1 tablet (850 mg total) by mouth 2 (two) times daily.    Dispense:  180 tablet    Refill:  3   ezetimibe  (ZETIA ) 10 MG tablet    Sig: Take 1 tablet (10 mg total) by mouth daily.    Dispense:  90 tablet    Refill:  3   nitroGLYCERIN  (NITROSTAT ) 0.4 MG SL tablet    Sig: Place 1 tablet (0.4 mg total) under the tongue every 5 (five) minutes as needed for up to 25 days for chest pain.    Dispense:  25 tablet    Refill:  6     History of Present Illness:    Larry Warner.  is a 68 y.o. male with a hx of CAD with anterior MI PCI and drug-eluting stent to the left anterior descending coronary artery left circumflex coronary artery January 2019 LV dysfunction EF 35 to 40% hypertensive heart disease hyperlipidemia type 2 diabetes with the most recent ejection fraction 40% last seen 09/05/2023.  Has been intolerant of ACE ARB and spironolactone .  Recent labs 06/19/2024 CMP normal creatinine 0.89 GFR 95 cc/min sodium 140 potassium 4.5 normal LFTs lipid profile cholesterol 100 LDL 54 non-HDL cholesterol 70 and A1c 6.8%  Compliance with diet, lifestyle and medications: Yes  He continues to do well very active and is having no cardiovascular symptoms of edema shortness of breath chest pain palp potation or syncope His statin dose was increased and subsequent lipid profile 04/15/2024 is ideal with an LDL 49 cholesterol 98 and 9 HDL cholesterol 49 He has no muscle pain or weakness from his statin. 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  The report date is 12/28/2018.   GERD (gastroesophageal reflux disease)    Heart attack (HCC)    History of colon polyps 12/17/2018   Hyperglycemia 01/01/2018   Hyperlipidemia    Jaundice of recent onset 03/14/2018   Left ventricular dysfunction 05/10/2019   Nicotine abuse 01/01/2018   Plantar fasciitis 07/13/2017    Small bowel polyp 05/07/2024   ST elevation myocardial infarction involving left anterior descending (LAD) coronary artery (HCC) 01/01/2018   Formatting of this note might be different from the original. Added automatically from request for surgery [id number]   Type 2 diabetes mellitus with circulatory disorder (HCC) 01/30/2018   Unstable angina (HCC) 09/13/2019   Wears glasses     Current Medications: Current Meds  Medication Sig   ALPRAZolam (XANAX) 0.25 MG tablet Take 0.25 mg by mouth 2 (two) times daily as needed for anxiety.   atorvastatin  (LIPITOR) 80 MG tablet Take 1 tablet (80 mg total) by mouth daily.   celecoxib (CELEBREX) 200 MG capsule Take 200 mg by mouth 2 (two) times daily.   cetirizine (ZYRTEC) 10 MG tablet Take 10 mg by mouth 2 (two) times daily.   clopidogrel  (PLAVIX ) 75 MG tablet TAKE 1 TABLET DAILY   omeprazole (PRILOSEC) 20 MG capsule Take 20 mg by mouth daily.   ranolazine  (RANEXA ) 500 MG 12 hr tablet Take 1 tablet (500 mg total) by mouth 2 (two) times daily.   tadalafil (CIALIS) 20 MG tablet Take 20 mg by mouth daily as needed for erectile dysfunction.   testosterone cypionate (DEPOTESTOSTERONE CYPIONATE) 200 MG/ML injection Inject 200 mg into the muscle every 14 (fourteen) days.   [DISCONTINUED] ezetimibe  (ZETIA ) 10 MG tablet Take 1 tablet (10 mg total) by mouth daily.   [DISCONTINUED] metFORMIN (GLUCOPHAGE) 850 MG tablet Take 850 mg by mouth 2 (two) times daily.   [DISCONTINUED] nitroGLYCERIN  (NITROSTAT ) 0.4 MG SL tablet DISSOLVE 1 TABLET UNDER THE TONGUE EVERY 5 MINUTES AS NEEDED FOR CHEST PAIN      EKGs/Labs/Other Studies Reviewed:    The following studies were reviewed today:  Cardiac Studies & Procedures   ______________________________________________________________________________________________ CARDIAC CATHETERIZATION  CARDIAC CATHETERIZATION 09/13/2019  Conclusion  Previously placed Prox LAD to Mid LAD stent (unknown type) is widely patent.   Previously placed Prox Cx to Dist Cx stent (unknown type) is widely patent.  Ramus lesion is 100% stenosed.  LPAV lesion is 100% stenosed with 100% stenosed side branch in LPDA.  There is moderate left ventricular systolic dysfunction.  LV end diastolic pressure is normal.  The left ventricular ejection fraction is 35-45% by visual estimate.  1. CAD. Left dominant circulation - Widely patent stent in the proximal LAD - Widely patent stent in the mid LCx - a tiny, wispy intermediate branch is occluded - 100% proximal left PDA. There are collaterals to this vessel. The vessel is small in caliber 2. Moderate LV dysfunction. EF estimated at 40%. 3. Normal LVEDP  Plan; recommend medical management. It is unclear from old notes if PDA occlusion is old or more recent. Would need to review films from [facility] to know. At any rate this vessel is too small for PCI and is collateralized.  Findings Coronary Findings Diagnostic  Dominance: Left  Left Main Vessel was injected.  Vessel is normal in caliber. Vessel is angiographically normal.  Left Anterior Descending Previously placed Prox LAD to Mid LAD stent (unknown type) is widely patent.  Ramus Intermedius Vessel is small. Ramus lesion is 100% stenosed.  Left Circumflex Previously placed Prox Cx to Dist Cx stent (unknown type) is widely patent.  Left Posterior Descending Artery Collaterals LPDA filled by collaterals from Dist LAD.  Left Posterior Atrioventricular Artery LPAV lesion is 100% stenosed with 100% stenosed side branch in LPDA.  Right Coronary Artery Vessel was injected. Vessel is normal in caliber. Vessel is angiographically normal.  Intervention  No interventions have been documented.   STRESS TESTS  MYOCARDIAL PERFUSION IMAGING 09/12/2019   ECHOCARDIOGRAM  ECHOCARDIOGRAM COMPLETE 05/08/2023  Narrative ECHOCARDIOGRAM REPORT    Patient Name:   Larry Warner. Date of Exam: 05/08/2023 Medical Rec  #:  [id number]            Height:       68.0 in Accession #:    [id number]           Weight:       185.4 lb Date of Birth:  25-Nov-1956            BSA:          1.979 m Patient Age:    66 years             BP:           124/72 mmHg Patient Gender: M                    HR:           66 bpm. Exam Location:  [place]  Procedure: 2D Echo, Cardiac Doppler, Color Doppler and Strain Analysis  Indications:    Coronary artery disease involving native coronary artery of native heart without angina pectoris [I25.10 (ICD-10-CM)]; Ischemic cardiomyopathy [I25.5 (ICD-10-CM)]; Essential hypertension [I10 (ICD-10-CM)]; Mixed hyperlipidemia [E78.2 (ICD-10-CM)]; Type 2 diabetes mellitus without complication, without long-term current use of insulin (HCC) [E11.9 (ICD-10-CM)]  History:        Patient has prior history of Echocardiogram examinations, most recent 02/25/2022. Cardiomyopathy, CAD; Risk Factors:Hypertension.  Sonographer:    Lynwood Silvas RDCS Referring Phys: [id number] Aleaha Fickling J Adelayde Minney  IMPRESSIONS   1. Left ventricular ejection fraction, by estimation, is 40%%. The left ventricle has normal function. The left ventricle demonstrates regional wall motion abnormalities (see scoring diagram/findings for description). There is mild left ventricular hypertrophy. Left ventricular diastolic parameters are consistent with Grade I diastolic dysfunction (impaired relaxation). The average left ventricular global longitudinal strain is -12.6 %. The global longitudinal strain is abnormal. 2. Right ventricular systolic function is mildly reduced. The right ventricular size is normal. There is normal pulmonary artery systolic pressure. 3. The mitral valve is degenerative. No evidence of mitral valve regurgitation. No evidence of mitral stenosis. 4. The aortic valve is tricuspid. Aortic valve regurgitation is not visualized. No aortic stenosis is present. 5. Aortic Normal DTA. 6. The inferior vena cava is normal in  size with greater than 50% respiratory variability, suggesting right atrial pressure of 3 mmHg.  FINDINGS Left Ventricle: Left ventricular ejection fraction, by estimation, is 40%%. The left ventricle has normal function. The left ventricle demonstrates regional wall motion abnormalities. Definity  contrast agent was given IV to delineate the left ventricular endocardial borders. The average left ventricular global longitudinal strain is -12.6 %. The global longitudinal strain is abnormal. The left ventricular internal cavity size was normal in  size. There is mild left ventricular hypertrophy. Left ventricular diastolic parameters are consistent with Grade I diastolic dysfunction (impaired relaxation). Indeterminate filling pressures.   LV Wall Scoring: The mid anteroseptal segment and apex are akinetic. The inferior septum, basal anteroseptal segment, apical anterior segment, and apical inferior segment are hypokinetic. The anterior wall, entire lateral wall, and inferior wall are normal.  Right Ventricle: The right ventricular size is normal. No increase in right ventricular wall thickness. Right ventricular systolic function is mildly reduced. There is normal pulmonary artery systolic pressure. The tricuspid regurgitant velocity is 1.13 m/s, and with an assumed right atrial pressure of 3 mmHg, the estimated right ventricular systolic pressure is 8.1 mmHg.  Left Atrium: Left atrial size was normal in size.  Right Atrium: Right atrial size was normal in size.  Pericardium: There is no evidence of pericardial effusion.  Mitral Valve: The mitral valve is degenerative in appearance. Mild mitral annular calcification. No evidence of mitral valve regurgitation. No evidence of mitral valve stenosis.  Tricuspid Valve: The tricuspid valve is normal in structure. Tricuspid valve regurgitation is not demonstrated. No evidence of tricuspid stenosis.  Aortic Valve: The aortic valve is tricuspid.  Aortic valve regurgitation is not visualized. No aortic stenosis is present.  Pulmonic Valve: The pulmonic valve was normal in structure. Pulmonic valve regurgitation is not visualized. No evidence of pulmonic stenosis.  Aorta: Normal DTA, the aortic arch was not well visualized and the aortic root and ascending aorta are structurally normal, with no evidence of dilitation.  Venous: The pulmonary veins were not well visualized. The inferior vena cava is normal in size with greater than 50% respiratory variability, suggesting right atrial pressure of 3 mmHg.  IAS/Shunts: No atrial level shunt detected by color flow Doppler.   LEFT VENTRICLE PLAX 2D LVIDd:         5.30 cm      Diastology LVIDs:         4.75 cm      LV e' medial:    5.11 cm/s LV PW:         1.20 cm      LV E/e' medial:  18.5 LV IVS:        1.20 cm      LV e' lateral:   5.55 cm/s LVOT diam:     2.00 cm      LV E/e' lateral: 17.0 LV SV:         52 LV SV Index:   26           2D Longitudinal Strain LVOT Area:     3.14 cm     2D Strain GLS Avg:     -12.6 %  LV Volumes (MOD) LV vol d, MOD A2C: 86.5 ml LV vol d, MOD A4C: 108.0 ml LV vol s, MOD A2C: 50.1 ml LV vol s, MOD A4C: 56.2 ml LV SV MOD A2C:     36.4 ml LV SV MOD A4C:     108.0 ml LV SV MOD BP:      43.5 ml  RIGHT VENTRICLE            IVC RV Basal diam:  3.00 cm    IVC diam: 1.40 cm RV S prime:     9.90 cm/s TAPSE (M-mode): 1.6 cm  LEFT ATRIUM           Index        RIGHT ATRIUM  Index LA diam:      3.80 cm 1.92 cm/m   RA Area:     15.50 cm LA Vol (A4C): 40.1 ml 20.26 ml/m  RA Volume:   37.50 ml  18.95 ml/m AORTIC VALVE LVOT Vmax:   72.70 cm/s LVOT Vmean:  48.725 cm/s LVOT VTI:    0.164 m  AORTA Ao Root diam: 3.20 cm Ao Asc diam:  3.40 cm Ao Desc diam: 2.10 cm  MV E velocity: 94.30 cm/s   TRICUSPID VALVE MV A velocity: 111.00 cm/s  TR Peak grad:   5.1 mmHg MV E/A ratio:  0.85         TR Vmax:        113.00 cm/s  SHUNTS Systemic VTI:   0.16 m Systemic Diam: 2.00 cm  Redell Leiter MD Electronically signed by Redell Leiter MD Signature Date/Time: 05/08/2023/12:03:58 PM    Final    MONITORS  LONG TERM MONITOR (3-14 DAYS) 11/25/2019  Narrative A ZIO monitor was performed for 5 days and 5 hours beginning 11/07/2019 to assess for arrhythmia associated with CAD and reduced ejection fraction 34% by gated pool 35 to 40% by echocardiogram.  The cardiac rhythm throughout was sinus with minimum average and maximum heart rates of 52, 77 and 119 bpm.  There were no episodes of atrial fibrillation or flutter.  There were no pauses of 3 seconds or greater.  Ventricular ectopy was rare with isolated PVCs.  Supraventricular ectopy was rare with isolated APCs.  There were no diary events.  There were 3 triggered events 1 associated with a single atrial premature contraction.   Conclusion: Unremarkable ZIO monitor with no evidence of complex arrhythmia in the setting of CAD and reduced ejection fraction.       ______________________________________________________________________________________________      EKG Interpretation Date/Time:  Monday September 09 2024 07:53:52 EDT Ventricular Rate:  83 PR Interval:  148 QRS Duration:  104 QT Interval:  362 QTC Calculation: 425 R Axis:   -11  Text Interpretation: Sinus rhythm with Premature atrial complexes Incomplete right bundle branch block Cannot rule out Anteroseptal infarct (cited on or before 06-Jan-2020) When compared with ECG of 05-Sep-2023 15:20, Premature atrial complexes are now Present Confirmed by Leiter Redell ([id number]) on 09/09/2024 8:00:21 AM   Recent Labs: 04/15/2024: ALT 18  Recent Lipid Panel    Component Value Date/Time   CHOL 98 (L) 04/15/2024 0807   TRIG 76 04/15/2024 0807   HDL 33 (L) 04/15/2024 0807   CHOLHDL 3.0 04/15/2024 0807   LDLCALC 49 04/15/2024 0807    Physical Exam:    VS:  BP 118/64   Pulse 83   Ht 5' 8 (1.727 m)   Wt 189 lb 9.6  oz (86 kg)   SpO2 99%   BMI 28.83 kg/m     Wt Readings from Last 3 Encounters:  09/09/24 189 lb 9.6 oz (86 kg)  05/07/24 180 lb (81.6 kg)  03/07/24 192 lb 2 oz (87.1 kg)     GEN:  Well nourished, well developed in no acute distress HEENT: Normal NECK: No JVD; No carotid bruits LYMPHATICS: No lymphadenopathy CARDIAC: RRR, no murmurs, rubs, gallops RESPIRATORY:  Clear to auscultation without rales, wheezing or rhonchi  ABDOMEN: Soft, non-tender, non-distended MUSCULOSKELETAL:  No edema; No deformity  SKIN: Warm and dry NEUROLOGIC:  Alert and oriented x 3 PSYCHIATRIC:  Normal affect    Signed, Redell Leiter, MD  09/09/2024 8:19 AM    [facility]

## 2024-09-09 ENCOUNTER — Ambulatory Visit: Attending: Cardiology | Admitting: Cardiology

## 2024-09-09 ENCOUNTER — Encounter: Payer: Self-pay | Admitting: Cardiology

## 2024-09-09 VITALS — BP 118/64 | HR 83 | Ht 68.0 in | Wt 189.6 lb

## 2024-09-09 DIAGNOSIS — I119 Hypertensive heart disease without heart failure: Secondary | ICD-10-CM

## 2024-09-09 DIAGNOSIS — I10 Essential (primary) hypertension: Secondary | ICD-10-CM | POA: Diagnosis not present

## 2024-09-09 DIAGNOSIS — I255 Ischemic cardiomyopathy: Secondary | ICD-10-CM

## 2024-09-09 DIAGNOSIS — I25118 Atherosclerotic heart disease of native coronary artery with other forms of angina pectoris: Secondary | ICD-10-CM

## 2024-09-09 DIAGNOSIS — E782 Mixed hyperlipidemia: Secondary | ICD-10-CM

## 2024-09-09 DIAGNOSIS — E119 Type 2 diabetes mellitus without complications: Secondary | ICD-10-CM

## 2024-09-09 MED ORDER — NITROGLYCERIN 0.4 MG SL SUBL: 0.4000 mg | SUBLINGUAL_TABLET | SUBLINGUAL | 6 refills | Status: AC | PRN

## 2024-09-09 MED ORDER — METFORMIN HCL 850 MG PO TABS: 850.0000 mg | ORAL_TABLET | Freq: Two times a day (BID) | ORAL | 3 refills | Status: AC

## 2024-09-09 MED ORDER — EZETIMIBE 10 MG PO TABS: 10.0000 mg | ORAL_TABLET | Freq: Every day | ORAL | 3 refills | Status: AC

## 2024-09-09 NOTE — Patient Instructions (Signed)
 Medication Instructions:  Your physician recommends that you continue on your current medications as directed. Please refer to the Current Medication list given to you today.  *If you need a refill on your cardiac medications before your next appointment, please call your pharmacy*  Lab Work: Your physician recommends that you return for lab work in:   Labs in November : CMP, Lipids, Apo B  If you have labs (blood work) drawn today and your tests are completely normal, you will receive your results only by: MyChart Message (if you have MyChart) OR A paper copy in the mail If you have any lab test that is abnormal or we need to change your treatment, we will call you to review the results.  Testing/Procedures: Vascuscreen  Your physician has requested that you have an echocardiogram. Echocardiography is a painless test that uses sound waves to create images of your heart. It provides your doctor with information about the size and shape of your heart and how well your heart's chambers and valves are working. This procedure takes approximately one hour. There are no restrictions for this procedure. Please do NOT wear cologne, perfume, aftershave, or lotions (deodorant is allowed). Please arrive 15 minutes prior to your appointment time.  Please note: We ask at that you not bring children with you during ultrasound (echo/ vascular) testing. Due to room size and safety concerns, children are not allowed in the ultrasound rooms during exams. Our front office staff cannot provide observation of children in our lobby area while testing is being conducted. An adult accompanying a patient to their appointment will only be allowed in the ultrasound room at the discretion of the ultrasound technician under special circumstances. We apologize for any inconvenience.   Follow-Up: At [facility], you and your health needs are our priority.  As part of our continuing mission to provide you with  exceptional heart care, our providers are all part of one team.  This team includes your primary Cardiologist (physician) and Advanced Practice Providers or APPs (Physician Assistants and Nurse Practitioners) who all work together to provide you with the care you need, when you need it.  Your next appointment:   1 year(s)  Provider:   Redell Leiter, MD    We recommend signing up for the patient portal called MyChart.  Sign up information is provided on this After Visit Summary.  MyChart is used to connect with patients for Virtual Visits (Telemedicine).  Patients are able to view lab/test results, encounter notes, upcoming appointments, etc.  Non-urgent messages can be sent to your provider as well.   To learn more about what you can do with MyChart, go to [email or web address].   Other Instructions None

## 2024-10-03 ENCOUNTER — Ambulatory Visit (INDEPENDENT_AMBULATORY_CARE_PROVIDER_SITE_OTHER)

## 2024-10-03 ENCOUNTER — Ambulatory Visit: Attending: Cardiology

## 2024-10-03 ENCOUNTER — Encounter: Payer: Self-pay | Admitting: Cardiology

## 2024-10-03 ENCOUNTER — Ambulatory Visit: Payer: Self-pay | Admitting: Cardiology

## 2024-10-03 DIAGNOSIS — E782 Mixed hyperlipidemia: Secondary | ICD-10-CM

## 2024-10-03 DIAGNOSIS — I255 Ischemic cardiomyopathy: Secondary | ICD-10-CM | POA: Diagnosis not present

## 2024-10-03 DIAGNOSIS — I10 Essential (primary) hypertension: Secondary | ICD-10-CM

## 2024-10-03 DIAGNOSIS — I119 Hypertensive heart disease without heart failure: Secondary | ICD-10-CM | POA: Diagnosis not present

## 2024-10-03 DIAGNOSIS — I25118 Atherosclerotic heart disease of native coronary artery with other forms of angina pectoris: Secondary | ICD-10-CM

## 2024-10-03 DIAGNOSIS — E119 Type 2 diabetes mellitus without complications: Secondary | ICD-10-CM

## 2024-10-03 LAB — ECHOCARDIOGRAM COMPLETE
AR max vel: 2.06 cm2
AV Area VTI: 2.04 cm2
AV Area mean vel: 1.99 cm2
AV Mean grad: 4 mmHg
AV Peak grad: 6.9 mmHg
Ao pk vel: 1.32 m/s
Area-P 1/2: 3.87 cm2
MV VTI: 0.88 cm2
S' Lateral: 4.5 cm

## 2024-10-04 ENCOUNTER — Encounter: Payer: Self-pay | Admitting: Cardiology

## 2024-12-04 ENCOUNTER — Other Ambulatory Visit: Payer: Self-pay

## 2024-12-04 MED ORDER — RANOLAZINE ER 500 MG PO TB12: 500.0000 mg | ORAL_TABLET | Freq: Two times a day (BID) | ORAL | 2 refills | Status: AC

## 2024-12-31 ENCOUNTER — Other Ambulatory Visit: Payer: Self-pay | Admitting: Cardiology
